# Patient Record
Sex: Female | Born: 1985 | Race: Black or African American | Hispanic: No | Marital: Single | Smoking: Current every day smoker
Health system: Southern US, Community
[De-identification: ages and names within clinical notes are randomized; demographics above are authoritative.]

## PROBLEM LIST (undated history)

## (undated) HISTORY — PX: WISDOM TOOTH EXTRACTION: SHX21

## (undated) HISTORY — PX: TUBAL LIGATION: SHX77

---
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
## 2004-10-15 ENCOUNTER — Emergency Department (HOSPITAL_COMMUNITY): Admission: EM | Admit: 2004-10-15 | Discharge: 2004-10-15 | Payer: Self-pay | Admitting: Family Medicine

## 2005-01-05 ENCOUNTER — Inpatient Hospital Stay (HOSPITAL_COMMUNITY): Admission: AD | Admit: 2005-01-05 | Discharge: 2005-01-05 | Payer: Self-pay | Admitting: *Deleted

## 2005-01-27 ENCOUNTER — Inpatient Hospital Stay (HOSPITAL_COMMUNITY): Admission: AD | Admit: 2005-01-27 | Discharge: 2005-01-27 | Payer: Self-pay | Admitting: *Deleted

## 2005-02-13 ENCOUNTER — Other Ambulatory Visit: Admission: RE | Admit: 2005-02-13 | Discharge: 2005-02-13 | Payer: Self-pay | Admitting: Obstetrics and Gynecology

## 2005-07-18 ENCOUNTER — Inpatient Hospital Stay (HOSPITAL_COMMUNITY): Admission: AD | Admit: 2005-07-18 | Discharge: 2005-07-20 | Payer: Self-pay | Admitting: Obstetrics and Gynecology

## 2006-02-24 ENCOUNTER — Emergency Department (HOSPITAL_COMMUNITY): Admission: EM | Admit: 2006-02-24 | Discharge: 2006-02-24 | Payer: Self-pay | Admitting: Emergency Medicine

## 2006-05-10 ENCOUNTER — Ambulatory Visit: Payer: Self-pay | Admitting: *Deleted

## 2006-05-10 ENCOUNTER — Inpatient Hospital Stay (HOSPITAL_COMMUNITY): Admission: AD | Admit: 2006-05-10 | Discharge: 2006-05-10 | Payer: Self-pay | Admitting: Gynecology

## 2006-07-14 ENCOUNTER — Ambulatory Visit: Payer: Self-pay | Admitting: *Deleted

## 2006-07-14 ENCOUNTER — Inpatient Hospital Stay (HOSPITAL_COMMUNITY): Admission: AD | Admit: 2006-07-14 | Discharge: 2006-07-14 | Payer: Self-pay | Admitting: Gynecology

## 2006-07-21 ENCOUNTER — Ambulatory Visit: Payer: Self-pay | Admitting: Obstetrics & Gynecology

## 2006-07-28 ENCOUNTER — Ambulatory Visit: Payer: Self-pay | Admitting: *Deleted

## 2006-07-30 ENCOUNTER — Inpatient Hospital Stay (HOSPITAL_COMMUNITY): Admission: AD | Admit: 2006-07-30 | Discharge: 2006-08-02 | Payer: Self-pay | Admitting: Obstetrics & Gynecology

## 2006-07-30 ENCOUNTER — Ambulatory Visit: Payer: Self-pay | Admitting: Obstetrics & Gynecology

## 2006-07-30 ENCOUNTER — Encounter (INDEPENDENT_AMBULATORY_CARE_PROVIDER_SITE_OTHER): Payer: Self-pay | Admitting: Specialist

## 2006-12-16 ENCOUNTER — Emergency Department (HOSPITAL_COMMUNITY): Admission: EM | Admit: 2006-12-16 | Discharge: 2006-12-16 | Payer: Self-pay | Admitting: Emergency Medicine

## 2007-02-07 ENCOUNTER — Encounter (INDEPENDENT_AMBULATORY_CARE_PROVIDER_SITE_OTHER): Payer: Self-pay | Admitting: Specialist

## 2007-02-07 ENCOUNTER — Ambulatory Visit (HOSPITAL_BASED_OUTPATIENT_CLINIC_OR_DEPARTMENT_OTHER): Admission: RE | Admit: 2007-02-07 | Discharge: 2007-02-07 | Payer: Self-pay | Admitting: Specialist

## 2007-11-26 ENCOUNTER — Emergency Department (HOSPITAL_COMMUNITY): Admission: EM | Admit: 2007-11-26 | Discharge: 2007-11-26 | Payer: Self-pay | Admitting: Family Medicine

## 2008-11-23 ENCOUNTER — Emergency Department (HOSPITAL_COMMUNITY): Admission: EM | Admit: 2008-11-23 | Discharge: 2008-11-23 | Payer: Self-pay | Admitting: Emergency Medicine

## 2008-12-29 ENCOUNTER — Emergency Department (HOSPITAL_COMMUNITY): Admission: EM | Admit: 2008-12-29 | Discharge: 2008-12-29 | Payer: Self-pay | Admitting: Emergency Medicine

## 2009-01-14 ENCOUNTER — Emergency Department (HOSPITAL_COMMUNITY): Admission: EM | Admit: 2009-01-14 | Discharge: 2009-01-14 | Payer: Self-pay | Admitting: Family Medicine

## 2009-04-21 ENCOUNTER — Emergency Department (HOSPITAL_COMMUNITY): Admission: EM | Admit: 2009-04-21 | Discharge: 2009-04-21 | Payer: Self-pay | Admitting: Emergency Medicine

## 2010-04-20 ENCOUNTER — Emergency Department (HOSPITAL_COMMUNITY): Admission: EM | Admit: 2010-04-20 | Discharge: 2010-04-20 | Payer: Self-pay | Admitting: Emergency Medicine

## 2010-09-26 ENCOUNTER — Emergency Department (HOSPITAL_COMMUNITY)
Admission: EM | Admit: 2010-09-26 | Discharge: 2010-09-26 | Disposition: A | Discharge status: Home / Self Care | Payer: Medicaid Other | Attending: Emergency Medicine | Admitting: Emergency Medicine

## 2010-09-26 DIAGNOSIS — H5710 Ocular pain, unspecified eye: Secondary | ICD-10-CM | POA: Insufficient documentation

## 2010-09-26 DIAGNOSIS — S0510XA Contusion of eyeball and orbital tissues, unspecified eye, initial encounter: Secondary | ICD-10-CM | POA: Insufficient documentation

## 2010-09-26 DIAGNOSIS — Y929 Unspecified place or not applicable: Secondary | ICD-10-CM | POA: Insufficient documentation

## 2010-09-26 DIAGNOSIS — H209 Unspecified iridocyclitis: Secondary | ICD-10-CM | POA: Insufficient documentation

## 2010-09-26 DIAGNOSIS — H5789 Other specified disorders of eye and adnexa: Secondary | ICD-10-CM | POA: Insufficient documentation

## 2010-09-26 DIAGNOSIS — IMO0002 Reserved for concepts with insufficient information to code with codable children: Secondary | ICD-10-CM | POA: Insufficient documentation

## 2010-10-20 ENCOUNTER — Inpatient Hospital Stay (INDEPENDENT_AMBULATORY_CARE_PROVIDER_SITE_OTHER)
Admission: RE | Admit: 2010-10-20 | Discharge: 2010-10-20 | Disposition: A | Discharge status: Home / Self Care | Payer: Medicaid Other | Source: Ambulatory Visit | Attending: Emergency Medicine | Admitting: Emergency Medicine

## 2010-10-20 DIAGNOSIS — L03319 Cellulitis of trunk, unspecified: Secondary | ICD-10-CM

## 2010-10-20 DIAGNOSIS — L02219 Cutaneous abscess of trunk, unspecified: Secondary | ICD-10-CM

## 2010-11-25 NOTE — Op Note (Signed)
Erin Logan, Erin Logan                   ACCOUNT NO.:  [id number]   MEDICAL RECORD NO.:  [id number]          PATIENT TYPE:  AMB   LOCATION:  DSC                          FACILITY:  [facility]   PHYSICIAN:  Earvin Hansen L. Shon Hough, M.D.DATE OF BIRTH:  [birth year]   DATE OF PROCEDURE:  02/07/2007  DATE OF DISCHARGE:                               OPERATIVE REPORT   SURGEON:  Earvin Hansen L. Shon Hough, M.D.   ANESTHESIA:  General.   INDICATIONS FOR PROCEDURE:  A 25 year old with severe hydradenitis  involving the right and left axillary regions, the right side greater  than left, and it creates boils and infections, necessitating multiple  incisions and drainages of the areas in the past.  The patient is now  being prepared for excision of the area - wide excision a Ryan-Pollack  closure, plastic fashion.   DESCRIPTION OF PROCEDURE:  The patient underwent general anesthesia,  intubated orally.  Preoperatively, the patient had had drawings  encompassing the whole hair-bearing area on the right axilla for a large  elliptical excision.  Prep was done with Hibiclens Soap and Solution and  walled off with sterile towels and drapes so as to make our sterile  field.  Xylocaine 0.5% with epinephrine was injected locally for  vasoconstriction, a total of 50 mL, 1:200,000 concentration.  The wound  edges were scored with a #15 blade.  Then, the Bovie was used on cutting  and coagulation for excision of the disease process down to underlying  superficial fascia.  We had to go in deeper in the midportion because of  the depth of the disease.  After proper hemostasis, the flaps were then  freed laterally and medially, and the flaps were transposed back into  the midline with 2-0 Monocryl deep subcu to the fascia, and then another  deep subcutaneous suture of 2-0 Monocryl, a 3-0 subdermal suture of  Monocryl, and then a running subcuticular suture of 3-0 Monocryl, then,  3-0 nylon, placed approximately 2 inches apart  for strength.  The wounds  were cleansed.  Half-inch Steri-Strips were applied.  Sterile dressings  were applied, including Xeroform, 4 x 4's, ABDs and Hyperfix tape.  She  withstood all the procedures very well, was taken to recovery in good  condition.  Estimated blood loss was less than 100 mL; complications,  none.      Yaakov Guthrie. Shon Hough, M.D.  Electronically Signed     GLT/MEDQ  D:  02/07/2007  T:  02/07/2007  Job:  [id number]

## 2010-11-28 NOTE — H&P (Signed)
Erin Logan, Erin Logan                   ACCOUNT NO.:  [id number]   MEDICAL RECORD NO.:  [id number]          PATIENT TYPE:  INP   LOCATION:  9171                          [facility]ILITY[facility]  [facility]   PHYSICIAN:  Naima A. Dillard, M.D. DATE OF BIRTH:  [birth year]   DATE OF ADMISSION:  07/18/2005  DATE OF DISCHARGE:                                HISTORY & PHYSICAL   This is a 25 year old gravida 1, para 0, at 40-4/7 weeks, who presents with  gush of fluid at midnight.  She does not have any bleeding and reports  positive fetal movement.  Pregnancy has been followed by the M.D. service  and remarkable for:   1.  Late to care.  2.  History of substance abuse.  3.  Smoker.  4.  Hidradenitis.  5.  Patient is a twin.  6.  History of abnormal Pap.  7.  Group B strep negative.   ALLERGIES:  None.   OBSTETRICAL HISTORY:  The patient is a primigravida.   MEDICAL HISTORY:  The patient has a history of abnormal Pap, history of  Trichomonas and childhood varicella.  The patient is a smoker and has  hidradenitis.   FAMILY HISTORY:  Remarkable for brother with paranoid schizophrenia, mother  with abuse and sister with a heart murmur.   SURGICAL HISTORY:  Negative.   GENETIC HISTORY:  Remarkable patient who is a twin and an uncle with twins.   SOCIAL HISTORY:  The patient is single.  Father of the baby, [place], is  involved and supportive.  She works as a C.N.A.  She is of the [employer].  She denies any alcohol abuse but did use marijuana early in  pregnancy and does smoke cigarettes.   PRENATAL LABORATORY DATA:  Hemoglobin 11.8, platelets 320.  Blood type O  positive.  Antibody screen negative.  Sickle cell negative.  RPR  nonreactive.  Rubella immune.  Hepatitis negative.  HIV negative.   HISTORY OF CURRENT PREGNANCY:  The patient entered care at 18 weeks.  She  had an ultrasound at 19 weeks, which was normal.  She had a Glucola at 29  weeks that was normal and was group B strep negative at  term.   OBJECTIVE:  VITAL SIGNS:  Stable, afebrile.  HEENT:  Within normal limits.  NECK:  Thyroid normal and not enlarged.  CHEST:  Clear to auscultation.  CARDIAC:  Regular rate and rhythm.  ABDOMEN:  Gravid.  Fetal monitor shows reactive fetal heart rate with  uterine contractions every eight minutes.  Cervix is 2, 90, -2, with a  vertex presentation.  Speculum exam shows a small amount of clear fluid with  positive Nitrazine, positive ferning.  EXTREMITIES: Within normal limits.   ASSESSMENT:  1.  Intrauterine pregnancy at 40-4/7 weeks.  2.  Premature rupture of membranes at term.  3.  Latent labor.   PLAN:  1.  Admit per Dr. Normand Sloop.  2.  Routine M.D. orders.  3.  Discussed expectant management versus Pitocin and the patient is not      sure what  she wants to do.      Marie L. Williams, C.N.M.      Naima A. Normand Sloop, M.D.  Electronically Signed    MLW/MEDQ  D:  07/18/2005  T:  07/18/2005  Job:  [id number]

## 2010-11-28 NOTE — Op Note (Signed)
Erin Logan, Erin Logan                   ACCOUNT NO.:  [id number]   MEDICAL RECORD NO.:  [id number]          PATIENT TYPE:  INP   LOCATION:  9316                          [facility]ACILITY:  [facility]   PHYSICIAN:  Phil D. Okey Dupre, M.D.     DATE OF BIRTH:  1985-09-08   DATE OF PROCEDURE:  07/30/2006  DATE OF DISCHARGE:                               OPERATIVE REPORT   PREOPERATIVE DIAGNOSIS:  Intrauterine pregnancy at 39+ weeks,  nonreassuring fetal heart tracing, desires permanent sterilization,  substance abuse, late prenatal care.   POSTOPERATIVE DIAGNOSIS:  Intrauterine pregnancy at 39+ weeks,  nonreassuring fetal heart tracing, desires permanent sterilization,  substance abuse, late prenatal care.   PROCEDURE:  Primary low transverse cesarean section and bilateral tubal  ligation with Filshie clips.   SURGEON:  Elsie Lincoln, MD   ASSISTANT:  Dr. Argentina Donovan and Dr. Wilburt Finlay.   ANESTHESIA:  General.   SPECIMEN:  Placenta sent to pathology.   ESTIMATED BLOOD LOSS:  700 mL.   COMPLICATIONS.:  None.   FINDINGS:  Viable female infant, Apgars 9 and 9 at one and five minutes,  respectively.  Please see delivery record for birth weight.   REASON FOR PROCEDURE:  This is a 25 year old gravida 2, para 1-0-0-1 at  64 plus weeks with history of cocaine abuse, limited prenatal care who  presented with rupture of membranes was started on Pitocin, progressed  to 5 cm dilation to follow up nonreassuring fetal heart tones and taken  for primary low transverse cesarean section   PROCEDURE:  The patient was taken to the operating room where she was  placed under general anesthesia.  She was then prepped and draped in the  normal sterile fashion in dorsal supine position with a leftward tilt.  A Pfannenstiel skin incision was made with the scalpel and carried  through to the underlying layer of fascia with the Bovie.  The fascia  was incised in the midline and the incision extended laterally with Mayo  scissors.  The superior aspect of the fascial incision was then grasped  with the Kocher clamps, elevated and underlying rectus muscles dissected  off bluntly.  Attention was then turned to the inferior aspect of the  incision which in a similar fashion was grasped, tented up with a Kocher  clamp and the rectus muscles dissected off bluntly.  The rectus muscles  were separated in midline and the peritoneum identified, tented up and  entered sharply with Metzenbaum scissors.  The peritoneal incision was  extended with good visualization of the bladder.  The bladder blade was  inserted and the lower uterine segment incised in a transverse fashion  with a scalpel. The incision was extended digitally.  The bladder blade  was removed and the infant's head was delivered atraumatically.  The  nose and mouth were suctioned and the cord clamp and cut.  The infant  was handed off to the waiting pediatricians.  The placenta was then  removed manually.  The uterus exteriorized and cleared of all clots and  debris.  The uterine incision was  repaired 1-0 chromic in a running  locked fashion.  Figure-of-eight stitches were used to reinforce the  uterine incision with excellent hemostasis.  Attention was then turned  to the tubal ligation.  The left fallopian tube was identified and  followed out to the fimbria.  The tube was grasped with two Babcock  clamps and Filshie clips were placed in the isthmus region of the  fallopian tube.  The right fallopian tube was then identified, followed  out to the fimbria.  Babcock clamps were used to grasp the tube and  Filshie clips were placed in the isthmus region of the tube.  Excellent  hemostasis was noted.  The uterus was returned to the abdomen and the  uterine incision was once again inspected and noted to have excellent  hemostasis.  The gutters were cleared of all clots and the fascia was  reapproximated with 0 Vicryl in running fashion.  The skin was  closed  with staples.  Of note, the uterus did seem somewhat boggy and 0.2 mg of  Methergine and 250 mcg of Hemabate and 1000 mg of Cytotec was given to  the patient in the OR.  The patient tolerated the procedure well.  Sponge, lap and needle counts were correct x2.  One gram of Ancef was  given at cord clamp.  The patient was taken to recovery room in stable  condition.     ______________________________  Paticia Stack, MD    ______________________________  Javier Glazier Okey Dupre, M.D.    Alphia Kava  D:  08/01/2006  T:  08/01/2006  Job:  [id number]

## 2010-11-28 NOTE — Discharge Summary (Signed)
Erin Logan, Erin Logan                   ACCOUNT NO.:  [id number]   MEDICAL RECORD NO.:  [id number]          PATIENT TYPE:  INP   LOCATION:  9316                          [facility]ILITY[facility]   PHYSICIAN:  Tracy L. Mayford Knife, M.D.DATE OF BIRTH:  09/03/1985   DATE OF ADMISSION:  07/30/2006  DATE OF DISCHARGE:  08/02/2006                               DISCHARGE SUMMARY   ADMISSION DIAGNOSES:  1. A 39-week 4-day intrauterine pregnancy dated by 28-week ultrasound      with spontaneous rupture of membranes.  2. Late prenatal care.  3. History of drug use.   DISCHARGE DIAGNOSES:  1. Status post primary low transverse cesarean section for      nonreassuring fetal heart tones.  2. Status post bilateral tubal ligation with Filshie clips.  3. Late prenatal care.  4. History of drug use this pregnancy.   PERTINENT LABORATORIES:  Admit hemoglobin was 10.5, discharge hemoglobin  10.   CONSULTANTS:  Social work.   HOSPITAL COURSE:  The patient is a 25 year old G2, P1-0-0-1 at 39 weeks  4 days by dated by a 28-week ultrasound that presented to the [facility] with  spontaneous rupture of membranes.  She was augmented with Pitocin and  progressed to 5 cm when fetal heart tones became nonreassuring.  The  patient was taken for primary cesarean section.  Please see operative  note for full details.  The patient had bilateral tubal ligation.   The postoperative course was unremarkable.  The patient voided,  ambulated, and was tolerating her pain without difficulty.  Social work  was consulted because of her history of drug use.  A CPS report was made  because the baby tested positive for marijuana.  Social work felt that  the patient was appropriately remorseful and the patient was discharged  home with her infant.   DISPOSITION:  Home.   FOLLOWUP:  Six weeks at [facility].   DISCHARGE MEDICATIONS:  1. Percocet 5 one to two p.o. q.4-6h. p.r.n.  2. Ibuprofen 600 mg p.o. q.6h. p.r.n.  3. Prenatal vitamins  daily.   DISCHARGE INSTRUCTIONS:  Routine including return with signs of  infection or increased vaginal bleeding.   ACTIVITY:  Nothing per vagina and no heavy lifting x6 weeks.           ______________________________  Marc Morgans Mayford Knife, M.D.     TLW/MEDQ  D:  09/01/2006  T:  09/01/2006  Job:  [id number]

## 2011-04-08 LAB — INFLUENZA A AND B ANTIGEN (CONVERTED LAB)
Inflenza A Ag: NEGATIVE
Influenza B Ag: NEGATIVE

## 2011-04-27 LAB — POCT HEMOGLOBIN-HEMACUE: Hemoglobin: 12.4

## 2012-03-24 ENCOUNTER — Encounter (HOSPITAL_COMMUNITY): Payer: Self-pay | Admitting: *Deleted

## 2012-03-24 ENCOUNTER — Emergency Department (HOSPITAL_COMMUNITY)
Admission: EM | Admit: 2012-03-24 | Discharge: 2012-03-24 | Disposition: A | Discharge status: Home / Self Care | Payer: Self-pay | Attending: Emergency Medicine | Admitting: Emergency Medicine

## 2012-03-24 DIAGNOSIS — G8929 Other chronic pain: Secondary | ICD-10-CM

## 2012-03-24 DIAGNOSIS — F17200 Nicotine dependence, unspecified, uncomplicated: Secondary | ICD-10-CM | POA: Insufficient documentation

## 2012-03-24 DIAGNOSIS — K029 Dental caries, unspecified: Secondary | ICD-10-CM | POA: Insufficient documentation

## 2012-03-24 MED ORDER — BUPIVACAINE-EPINEPHRINE PF 0.5-1:200000 % IJ SOLN
10.0000 mL | Freq: Once | INTRAMUSCULAR | Status: DC
  Filled 2012-03-24: qty 10

## 2012-03-24 MED ORDER — PENICILLIN V POTASSIUM 500 MG PO TABS: 500.0000 mg | ORAL_TABLET | Freq: Four times a day (QID) | ORAL | Status: AC

## 2012-03-24 MED ORDER — TRAMADOL HCL 50 MG PO TABS
100.0000 mg | ORAL_TABLET | Freq: Once | ORAL | Status: AC
  Administered 2012-03-24: 100 mg via ORAL
  Filled 2012-03-24: qty 2

## 2012-03-24 MED ORDER — PENICILLIN V POTASSIUM 250 MG PO TABS
500.0000 mg | ORAL_TABLET | Freq: Once | ORAL | Status: AC
  Administered 2012-03-24: 500 mg via ORAL
  Filled 2012-03-24: qty 2

## 2012-03-24 MED ORDER — TRAMADOL HCL 50 MG PO TABS: 50.0000 mg | ORAL_TABLET | Freq: Four times a day (QID) | ORAL | Status: AC | PRN

## 2012-03-24 MED ORDER — IBUPROFEN 400 MG PO TABS
800.0000 mg | ORAL_TABLET | Freq: Once | ORAL | Status: AC
  Administered 2012-03-24: 800 mg via ORAL
  Filled 2012-03-24: qty 2

## 2012-03-24 NOTE — ED Notes (Signed)
Patient reports she has a hole in her tooth on the left lower back tooth.  She states the took is broken.  Patient is also complaining of pain in her left ear

## 2012-03-24 NOTE — ED Notes (Signed)
Pt states she "cracked" the back tooth L side bottom roughly one year ago.  This area has become extremely painful (pain 10/10) with radiation to L ear. Part of the tooth is missing. Pt has used ASA, ibuprofen, and tramadol without relief.

## 2012-03-24 NOTE — ED Provider Notes (Signed)
History  This chart was scribed for Jones Skene, MD by Albertha Ghee Rifaie. This patient was seen in room TR06C/TR06C and the patient's care was started at 10:21 AM.   CSN: [id number]  Arrival date & time 03/24/12  1021   None     Chief Complaint  Patient presents with  . Dental Pain  . Otalgia     The history is provided by the patient. No language interpreter was used.   Erin Logan is a 26 y.o. female who presents to the Emergency Department complaining of one year of left lower dental pain that has been gradually worsening for the past 3 months that associates with a chipped tooth. She lists nausea and left otalgia as associated symptoms. Pain is aggravated with eating and drinking. She reports applying BC powder directly to the tooth with no improvement. She denies facial swelling, trouble swallowing, or sore throat as associated symptoms. Pt does not have a h/o chronic medical conditions. Pt is a current everyday smoker and occasional alcohol user.   History reviewed. No pertinent past medical history.  Past Surgical History  Procedure Date  . Wisdom tooth extraction     No family history on file.  History  Substance Use Topics  . Smoking status: Current Every Day Smoker  . Smokeless tobacco: Not on file  . Alcohol Use: Yes   No OB history provided  Review of Systems  REVIEW OF SYSTEMS:   1.) CONSTITUTIONAL: No fever, chills or systemic signs of infection. No recent, unexplained weight changes.   2.) HEENT: Positive for dental pain and left otalgia. No facial pain, sinus congestion or rhinorrhea is reported. Patient is denying any acute visual or hearing deficits. No sore throat or difficulty swallowing.  3.) NECK: No swelling or masses are reported.   4.) PULMONARY: No cough sputum production or shortness of breath was reported.   5.) CARDIAC: No palpitations, chest pain or pressure.   6.) ABDOMINAL: Positive for nausea. Denies abdominal pain, vomiting or  diarrhea. No Hematochezia or melena.  7.) GENITOURINARY: No burning with urination or frequency. No discharge.  8.) BACK: Denying any flank or CVA tenderness. No specific thoracic or lumbar pain.    9.) SKIN: No rashes, itching    Allergies  Review of patient's allergies indicates no known allergies.  Home Medications  No current outpatient prescriptions on file.  Triage Vitals: BP 111/70  Pulse 80  Temp 98.2 F (36.8 C) (Oral)  Resp 16  SpO2 100%  Physical Exam  Nursing notes reviewed.  Electronic medical record reviewed. VITAL SIGNS:   Filed Vitals:   03/24/12 1025  BP: 111/70  Pulse: 80  Temp: 98.2 F (36.8 C)  TempSrc: Oral  Resp: 16  SpO2: 100%   CONSTITUTIONAL: Awake, oriented, appears non-toxic HENT: Severely decayed #18.  No pus seen, no fluctuance near gums. Atraumatic, normocephalic, oral mucosa pink and moist, airway patent. Nares patent without drainage. External ears normal. EYES: Conjunctiva clear, EOMI, PERRLA NECK: Trachea midline, non-tender, supple CARDIOVASCULAR: Normal heart rate, Normal rhythm, No murmurs, rubs, gallops PULMONARY/CHEST: Clear to auscultation, no rhonchi, wheezes, or rales. Symmetrical breath sounds. Non-tender. ABDOMINAL: Non-distended, soft, non-tender - no rebound or guarding.  BS normal. NEUROLOGIC: Non-focal, moving all four extremities, no gross sensory or motor deficits. EXTREMITIES: No clubbing, cyanosis, or edema SKIN: Warm, Dry, No erythema, No rash   ED Course  Procedures (including critical care time)  DIAGNOSTIC STUDIES: Oxygen Saturation is 100% on room air, normal by my  interpretation.    COORDINATION OF CARE: 11:25AM-Discussed treatment plan which includes pain management with pt at bedside and pt agreed to plan. 12:39PM-Administered 2cc Marcaine w/ epi injection. Pt tolerated well.  Labs Reviewed - No data to display No results found.   No diagnosis found.    MDM  Erin Logan is a 26 y.o. female  is a very frustrated young woman presenting with dental pain.  Pt says she doesn't have money for a dentist and hoped the ER would pull her tooth.  Pt was going to leave AMA - but agreed to finally let me treat her.  Injected gums with marcaine and will give her Rx for ultram and PCN.  She is referred to [facility] dental clinic for extraction of severely decayed tooth. Return precautions given.  I personally performed the services described in this documentation, which was scribed in my presence. The recorded information has been reviewed and considered. Jones Skene, M.D.      Jones Skene, MD 03/28/12 1244

## 2012-04-08 ENCOUNTER — Emergency Department (HOSPITAL_COMMUNITY): Payer: Self-pay

## 2012-04-08 ENCOUNTER — Emergency Department (HOSPITAL_COMMUNITY)
Admission: EM | Admit: 2012-04-08 | Discharge: 2012-04-08 | Disposition: A | Discharge status: Home / Self Care | Payer: Self-pay | Attending: Emergency Medicine | Admitting: Emergency Medicine

## 2012-04-08 DIAGNOSIS — J029 Acute pharyngitis, unspecified: Secondary | ICD-10-CM

## 2012-04-08 DIAGNOSIS — B9789 Other viral agents as the cause of diseases classified elsewhere: Secondary | ICD-10-CM | POA: Insufficient documentation

## 2012-04-08 DIAGNOSIS — F17200 Nicotine dependence, unspecified, uncomplicated: Secondary | ICD-10-CM | POA: Insufficient documentation

## 2012-04-08 DIAGNOSIS — J069 Acute upper respiratory infection, unspecified: Secondary | ICD-10-CM | POA: Insufficient documentation

## 2012-04-08 LAB — RAPID STREP SCREEN (MED CTR MEBANE ONLY): Streptococcus, Group A Screen (Direct): NEGATIVE

## 2012-04-08 MED ORDER — IBUPROFEN 800 MG PO TABS: 800.0000 mg | ORAL_TABLET | Freq: Three times a day (TID) | ORAL | Status: DC | PRN

## 2012-04-08 MED ORDER — ACETAMINOPHEN-CODEINE 120-12 MG/5ML PO SOLN: 10.0000 mL | ORAL | Status: DC | PRN

## 2012-04-08 MED ORDER — GUAIFENESIN ER 1200 MG PO TB12: 1.0000 | ORAL_TABLET | Freq: Two times a day (BID) | ORAL | Status: DC

## 2012-04-08 NOTE — ED Provider Notes (Signed)
Medical screening examination/treatment/procedure(s) were performed by non-physician practitioner and as supervising physician I was immediately available for consultation/collaboration.   Celene Kras, MD 04/08/12 [phone]

## 2012-04-08 NOTE — ED Notes (Addendum)
Pt given work note for today

## 2012-04-08 NOTE — ED Notes (Signed)
Patient transported to X-ray 

## 2012-04-08 NOTE — ED Provider Notes (Signed)
History     CSN: [id number]  Arrival date & time 04/08/12  0751   First MD Initiated Contact with Patient 04/08/12 0800      Chief Complaint  Patient presents with  . Sore Throat    (Consider location/radiation/quality/duration/timing/severity/associated sxs/prior treatment) HPI Patient presents emergency department with sore throat, and cough for the last 24 hours.  Patient, states, that she has burning in her throat, and when she coughs she has discomfort in her chest.  Patient denies fevers, nausea, vomiting, abdominal pain, headache, visual changes, dizziness, or weakness.  Patient, states she took some over-the-counter cough suppressant, which worked for an hour or so, but her cough, returned. No past medical history on file.  Past Surgical History  Procedure Date  . Wisdom tooth extraction     No family history on file.  History  Substance Use Topics  . Smoking status: Current Every Day Smoker  . Smokeless tobacco: Not on file  . Alcohol Use: Yes    OB History    Grav Para Term Preterm Abortions TAB SAB Ect Mult Living                  Review of Systems All pertinent positives and negatives in the history of present illness  Allergies  Review of patient's allergies indicates no known allergies.  Home Medications   Current Outpatient Rx  Name Route Sig Dispense Refill  . OVER THE COUNTER MEDICATION Oral Take 20 mLs by mouth every 6 (six) hours as needed. For cough   childrens cough medicine.      BP 103/75  Pulse 110  Temp 97.8 F (36.6 C) (Oral)  Resp 18  SpO2 96%  LMP 03/18/2012  Physical Exam  Nursing note and vitals reviewed. Constitutional: She is oriented to person, place, and time. She appears well-developed and well-nourished.  HENT:  Head: Normocephalic and atraumatic. No trismus in the jaw.  Mouth/Throat: Uvula is midline and mucous membranes are normal. No uvula swelling. Posterior oropharyngeal erythema present. No oropharyngeal  exudate, posterior oropharyngeal edema or tonsillar abscesses.  Eyes: Pupils are equal, round, and reactive to light.  Neck: Normal range of motion. Neck supple.  Cardiovascular: Normal rate, regular rhythm and normal heart sounds.  Exam reveals no gallop and no friction rub.   No murmur heard. Pulmonary/Chest: Effort normal and breath sounds normal. No respiratory distress.  Neurological: She is alert and oriented to person, place, and time.  Skin: Skin is warm and dry. No rash noted.    ED Course  Procedures (including critical care time)   Labs Reviewed  RAPID STREP SCREEN   Dg Chest 2 View  04/08/2012  *RADIOLOGY REPORT*  Clinical Data: Cough, fever, nasal congestion.  CHEST - 2 VIEW  Comparison: None  Findings: Heart and mediastinal contours are within normal limits. No focal opacities or effusions.  No acute bony abnormality.  IMPRESSION: No active cardiopulmonary disease.   Original Report Authenticated By: Cyndie Chime, M.D.    Patient be treated for viral URI next of her HPI and physical exam findings.  Patient is advised to increase her fluid intake and rest as much as possible.  She is told to return here for any worsening in her condition    MDM          Carlyle Dolly, PA-C 04/08/12 [phone]

## 2012-04-08 NOTE — ED Notes (Signed)
Pt c/o sore throat and shob when lying down since yesterday

## 2012-04-08 NOTE — ED Notes (Addendum)
Pt dc'd home

## 2012-11-26 ENCOUNTER — Emergency Department (HOSPITAL_COMMUNITY)
Admission: EM | Admit: 2012-11-26 | Discharge: 2012-11-26 | Disposition: A | Discharge status: Home / Self Care | Payer: Self-pay | Attending: Emergency Medicine | Admitting: Emergency Medicine

## 2012-11-26 ENCOUNTER — Encounter (HOSPITAL_COMMUNITY): Payer: Self-pay | Admitting: Emergency Medicine

## 2012-11-26 DIAGNOSIS — L0501 Pilonidal cyst with abscess: Secondary | ICD-10-CM | POA: Insufficient documentation

## 2012-11-26 DIAGNOSIS — F17200 Nicotine dependence, unspecified, uncomplicated: Secondary | ICD-10-CM | POA: Insufficient documentation

## 2012-11-26 MED ORDER — SULFAMETHOXAZOLE-TRIMETHOPRIM 800-160 MG PO TABS: 1.0000 | ORAL_TABLET | Freq: Two times a day (BID) | ORAL | Status: DC

## 2012-11-26 MED ORDER — HYDROCODONE-ACETAMINOPHEN 5-325 MG PO TABS: 1.0000 | ORAL_TABLET | Freq: Four times a day (QID) | ORAL | Status: DC | PRN

## 2012-11-26 NOTE — ED Provider Notes (Signed)
History     CSN: [id number]  Arrival date & time 11/26/12  0940   First MD Initiated Contact with Patient 11/26/12 [phone]      Chief Complaint  Patient presents with  . Recurrent Skin Infections    (Consider location/radiation/quality/duration/timing/severity/associated sxs/prior treatment) HPI Comments: Patient presents to the emergency department with chief complaint of abscess. She states that she has noticed an abscess on her buttocks for the past week. She states that it is very painful. She describes her pain as a sharp aching pain, but is worsened with sitting and walking. She states the pain is moderate to severe. She has not tried anything to alleviate her symptoms. She has a history of abscesses under her arms. She denies fevers, chills, nausea, or vomiting.  The history is provided by the patient. No language interpreter was used.    History reviewed. No pertinent past medical history.  Past Surgical History  Procedure Laterality Date  . Wisdom tooth extraction      History reviewed. No pertinent family history.  History  Substance Use Topics  . Smoking status: Current Every Day Smoker  . Smokeless tobacco: Not on file  . Alcohol Use: Yes    OB History   Grav Para Term Preterm Abortions TAB SAB Ect Mult Living                  Review of Systems  All other systems reviewed and are negative.    Allergies  Review of patient's allergies indicates no known allergies.  Home Medications   Current Outpatient Rx  Name  Route  Sig  Dispense  Refill  . HYDROcodone-acetaminophen (NORCO/VICODIN) 5-325 MG per tablet   Oral   Take 1 tablet by mouth every 6 (six) hours as needed for pain.   13 tablet   0   . sulfamethoxazole-trimethoprim (SEPTRA DS) 800-160 MG per tablet   Oral   Take 1 tablet by mouth every 12 (twelve) hours.   20 tablet   0     BP 107/82  Pulse 72  Temp(Src) 97.8 F (36.6 C) (Oral)  Resp 16  SpO2 100%  LMP 11/23/2012  Physical  Exam  Nursing note and vitals reviewed. Constitutional: She is oriented to person, place, and time. She appears well-developed and well-nourished.  HENT:  Head: Normocephalic and atraumatic.  Eyes: Conjunctivae and EOM are normal.  Neck: Normal range of motion.  Cardiovascular: Normal rate.   Pulmonary/Chest: Effort normal.  Abdominal: She exhibits no distension.  Musculoskeletal: Normal range of motion.  Neurological: She is alert and oriented to person, place, and time.  Skin: Skin is dry.  3 x 3 cm pilonidal abscess, with mild induration, and fluctuance, no surrounding erythema or cellulitis  Psychiatric: She has a normal mood and affect. Her behavior is normal. Judgment and thought content normal.    ED Course  Procedures (including critical care time)  Labs Reviewed - No data to display No results found. INCISION AND DRAINAGE Performed by: Roxy Horseman Consent: Verbal consent obtained. Risks and benefits: risks, benefits and alternatives were discussed Type: abscess  Body area: Buttock  Anesthesia: local infiltration  Incision was made with a scalpel.  Local anesthetic: lidocaine 2 % with epinephrine  Anesthetic total: 5 ml  Complexity: complex Blunt dissection to break up loculations  Drainage: purulent  Drainage amount: 10-15 ml  Packing material: Not packed   Patient tolerance: Patient tolerated the procedure well with no immediate complications.     1. Pilonidal  abscess       MDM  Patient with pilonidal abscess. The abscess was drained in the emergency department with good success. Will discharge the patient to home with Bactrim and Vicodin. Followup with [facility] if symptoms worsen or return. Patient does not have fever, chills, nausea, or vomiting. Vitals are stable. Patient is stable and ready for discharge.        Roxy Horseman, PA-C 11/26/12 1116

## 2012-11-26 NOTE — ED Notes (Signed)
Provider completed  I&D area cleaned with NS and sterile dry dressing applied.

## 2012-11-26 NOTE — ED Notes (Signed)
Patient discharge with instructions and she verbalizes an understanding

## 2012-11-26 NOTE — ED Notes (Signed)
Patient presents to the ED with complaints of reoccurring abscesses. Area affected is coccyx area times 1 week, redness and skin is hot to touch no drainage noted

## 2012-11-27 NOTE — ED Provider Notes (Signed)
Medical screening examination/treatment/procedure(s) were performed by non-physician practitioner and as supervising physician I was immediately available for consultation/collaboration.  Gilda Crease, MD 11/27/12 [phone]

## 2013-06-23 ENCOUNTER — Encounter (HOSPITAL_COMMUNITY): Payer: Self-pay | Admitting: Emergency Medicine

## 2013-06-23 ENCOUNTER — Emergency Department (HOSPITAL_COMMUNITY)
Admission: EM | Admit: 2013-06-23 | Discharge: 2013-06-23 | Disposition: A | Discharge status: Home / Self Care | Payer: Medicaid Other | Attending: Emergency Medicine | Admitting: Emergency Medicine

## 2013-06-23 DIAGNOSIS — Z7251 High risk heterosexual behavior: Secondary | ICD-10-CM

## 2013-06-23 DIAGNOSIS — R109 Unspecified abdominal pain: Secondary | ICD-10-CM

## 2013-06-23 DIAGNOSIS — N898 Other specified noninflammatory disorders of vagina: Secondary | ICD-10-CM | POA: Insufficient documentation

## 2013-06-23 DIAGNOSIS — Z3202 Encounter for pregnancy test, result negative: Secondary | ICD-10-CM | POA: Insufficient documentation

## 2013-06-23 DIAGNOSIS — Z9851 Tubal ligation status: Secondary | ICD-10-CM | POA: Insufficient documentation

## 2013-06-23 DIAGNOSIS — F17200 Nicotine dependence, unspecified, uncomplicated: Secondary | ICD-10-CM | POA: Insufficient documentation

## 2013-06-23 LAB — CBC
Hemoglobin: 12.9 g/dL (ref 12.0–15.0)
MCH: 31.2 pg (ref 26.0–34.0)
MCHC: 35.1 g/dL (ref 30.0–36.0)
Platelets: 321 10*3/uL (ref 150–400)
RBC: 4.14 MIL/uL (ref 3.87–5.11)

## 2013-06-23 LAB — POCT I-STAT, CHEM 8
Creatinine, Ser: 1 mg/dL (ref 0.50–1.10)
Hemoglobin: 13.9 g/dL (ref 12.0–15.0)
Potassium: 3.5 mEq/L (ref 3.5–5.1)
Sodium: 142 mEq/L (ref 135–145)
TCO2: 23 mmol/L (ref 0–100)

## 2013-06-23 LAB — URINALYSIS, ROUTINE W REFLEX MICROSCOPIC
Bilirubin Urine: NEGATIVE
Glucose, UA: NEGATIVE mg/dL
Protein, ur: NEGATIVE mg/dL
Urobilinogen, UA: 0.2 mg/dL (ref 0.0–1.0)

## 2013-06-23 LAB — WET PREP, GENITAL: Trich, Wet Prep: NONE SEEN

## 2013-06-23 LAB — URINE MICROSCOPIC-ADD ON

## 2013-06-23 MED ORDER — LIDOCAINE HCL 2 % IJ SOLN
2.0000 mL | Freq: Once | INTRAMUSCULAR | Status: AC
  Administered 2013-06-23: 0.5 mg
  Filled 2013-06-23: qty 20

## 2013-06-23 MED ORDER — AZITHROMYCIN 250 MG PO TABS
1000.0000 mg | ORAL_TABLET | Freq: Once | ORAL | Status: AC
  Administered 2013-06-23: 1000 mg via ORAL
  Filled 2013-06-23: qty 4

## 2013-06-23 MED ORDER — CEFTRIAXONE SODIUM 250 MG IJ SOLR
250.0000 mg | Freq: Once | INTRAMUSCULAR | Status: AC
  Administered 2013-06-23: 250 mg via INTRAMUSCULAR
  Filled 2013-06-23: qty 250

## 2013-06-23 NOTE — ED Notes (Signed)
Pt up to restroom with out any assistance, pt with a steady gait

## 2013-06-23 NOTE — ED Notes (Signed)
Pt undressed, in gown, on continuous pulse oximetry and blood pressure cuff 

## 2013-06-23 NOTE — ED Provider Notes (Signed)
CSN: [id number]     Arrival date & time 06/23/13  8657 History   First MD Initiated Contact with Patient 06/23/13 [phone]     Chief Complaint  Patient presents with  . Abdominal Pain   (Consider location/radiation/quality/duration/timing/severity/associated sxs/prior Treatment) HPI Comments: Patient is a 27 y/o G2P2 female with a hx of tubal ligation who presents today for abdominal cramping x 9 days. Patient states that symptoms are intermittent and mildly relieved with ibuprofen. She endorses the pain to be primarily in her lower abdomen/suprpubic region, but also sporadically migrates to her epigastrium. Symptom onset was prior to patient's menses which began 1 week ago and ended yesterday. Flow of menses was normal, per patient. She endorses an associated thick vaginal discharge. She denies associated fever, CP, SOB, vomiting, diarrhea, melena, hematochezia, dysuria, hematuria, numbness/tingling, and weakness. She endorses unprotected sexual intercourse with 1 partner; her boyfriend of 6 months.  The history is provided by the patient. No language interpreter was used.    History reviewed. No pertinent past medical history. Past Surgical History  Procedure Laterality Date  . Wisdom tooth extraction    . Tubal ligation     History reviewed. No pertinent family history. History  Substance Use Topics  . Smoking status: Current Every Day Smoker  . Smokeless tobacco: Not on file  . Alcohol Use: Yes   OB History   Grav Para Term Preterm Abortions TAB SAB Ect Mult Living                 Review of Systems  Gastrointestinal: Positive for abdominal pain ("cramping").  Genitourinary: Positive for vaginal discharge.  All other systems reviewed and are negative.    Allergies  Review of patient's allergies indicates no known allergies.  Home Medications   Current Outpatient Rx  Name  Route  Sig  Dispense  Refill  . Multiple Vitamin (MULTIVITAMIN WITH MINERALS) TABS tablet   Oral  Take 1 tablet by mouth daily.          BP 131/88  Pulse 68  Temp(Src) 98.9 F (37.2 C) (Oral)  Resp 20  SpO2 100%  LMP 06/21/2013  Physical Exam  Nursing note and vitals reviewed. Constitutional: She is oriented to person, place, and time. She appears well-developed and well-nourished. No distress.  HENT:  Head: Normocephalic and atraumatic.  Eyes: Conjunctivae and EOM are normal. No scleral icterus.  Neck: Normal range of motion.  Cardiovascular: Normal rate, regular rhythm and intact distal pulses.   Pulmonary/Chest: Effort normal. No respiratory distress.  Abdominal: Soft. She exhibits no mass. There is tenderness (mild lower abdominal TTP on deep palpation; no focal TTP appreciated). There is no rebound and no guarding.  Soft obese abdomen without peritoneal signs or guarding  Genitourinary: There is no rash, tenderness, lesion or injury on the right labia. There is no rash, tenderness, lesion or injury on the left labia. Uterus is not tender. Cervix exhibits no motion tenderness, no discharge and no friability. Right adnexum displays no mass, no tenderness and no fullness. Left adnexum displays no mass, no tenderness and no fullness. No tenderness or bleeding around the vagina. No foreign body around the vagina. No signs of injury around the vagina. Vaginal discharge (small amount of clear mucousy d/c in vault) found.  Musculoskeletal: Normal range of motion.  Neurological: She is alert and oriented to person, place, and time.  Skin: Skin is warm and dry. No rash noted. She is not diaphoretic. No erythema. No pallor.  Psychiatric: She  has a normal mood and affect. Her behavior is normal.    ED Course  Procedures (including critical care time) Labs Review Labs Reviewed  WET PREP, GENITAL - Abnormal; Notable for the following:    WBC, Wet Prep HPF POC TOO NUMEROUS TO COUNT (*)    All other components within normal limits  URINALYSIS, ROUTINE W REFLEX MICROSCOPIC - Abnormal;  Notable for the following:    Hgb urine dipstick TRACE (*)    All other components within normal limits  URINE MICROSCOPIC-ADD ON - Abnormal; Notable for the following:    Squamous Epithelial / LPF FEW (*)    All other components within normal limits  GC/CHLAMYDIA PROBE AMP  PREGNANCY, URINE  CBC  POCT I-STAT, CHEM 8   Imaging Review No results found.  EKG Interpretation   None       MDM   1. Unprotected sexual intercourse   2. Abdominal cramping    Patient is a 27 year old female who presents for lower abdominal cramping that is intermittent and has been present for the last 10 days. Patient denies associated fever, vomiting, and pelvic pain, but she does endorse some associated vaginal discharge. Patient with a history of unprotected sexual intercourse with her boyfriend of 6 months. She is well and nontoxic appearing, hemodynamically stable, and afebrile on arrival today. Physical exam significant for mild tenderness to palpation in the suprapubic region with deep palpation. No peritoneal signs or guarding appreciated. Pelvic and bimanual exam appreciated no significant CMT or adnexal tenderness.  Patient without leukocytosis today. Urinalysis nonsuggestive of infection. Urine pregnancy negative. Wet prep today with TNTC white blood cells without evidence of yeast infection or bacterial vaginosis. Given patient's symptoms and history of unprotected sexual intercourse, patient will be treated today for gonorrhea and Chlamydia. Do not believe emergent pelvic U/S imaging is indicated at this time as my suspicion for TOA, salpingitis, and ovarian torsion is low given duration of symptoms, lack of fever/severe abdominal pain/vomiting, and lack of leukocytosis. Patient stable for discharge with an OB/GYN followup as an outpatient. Patient has been advised to refrain from sexual intercourse until her sexual partners are tested and treated for STDs. Return precautions discussed and patient  agreeable to plan with no unaddressed concerns.    Antony Madura, PA-C 06/23/13 1139

## 2013-06-23 NOTE — ED Notes (Addendum)
Pt reports lower abd pain x7-10 days ago, states she started her menstrual cycle 7 days ago and completed her cycle 2 days ago, pt reports having a bright oarnge, red color to her menstrual cycle, pt also reports having a heavier vaginal discharge but denies any vaginal odor or burning with urination. Pt states she also had vaginal itching while on her cycle but has now subsided

## 2013-06-23 NOTE — ED Notes (Signed)
Pt discharged home with all belongings, alert and ambulatory, no new RX prescribed, pt verbalizes understanding of discharge instructions, no narcotics given in ED, pt drove self home

## 2013-06-23 NOTE — ED Notes (Signed)
Pt c/o abd pain x7-10 days

## 2013-06-24 LAB — GC/CHLAMYDIA PROBE AMP
CT Probe RNA: POSITIVE — AB
GC Probe RNA: NEGATIVE

## 2013-06-24 NOTE — ED Provider Notes (Signed)
Medical screening examination/treatment/procedure(s) were performed by non-physician practitioner and as supervising physician I was immediately available for consultation/collaboration.  EKG Interpretation   None         Laray Anger, DO 06/24/13 1620

## 2013-06-25 ENCOUNTER — Telehealth (HOSPITAL_COMMUNITY): Payer: Self-pay | Admitting: Emergency Medicine

## 2013-06-25 NOTE — ED Notes (Signed)
Patient has +Chlamydia. 

## 2013-06-25 NOTE — ED Notes (Signed)
+  Chlamydia. Patient treated with Rocephin and Zithromax. [employer] faxed. 

## 2015-03-20 ENCOUNTER — Emergency Department (HOSPITAL_COMMUNITY)
Admission: EM | Admit: 2015-03-20 | Discharge: 2015-03-20 | Disposition: A | Discharge status: Home / Self Care | Payer: Medicaid Other | Attending: Emergency Medicine | Admitting: Emergency Medicine

## 2015-03-20 ENCOUNTER — Encounter (HOSPITAL_COMMUNITY): Payer: Self-pay | Admitting: Emergency Medicine

## 2015-03-20 DIAGNOSIS — Z720 Tobacco use: Secondary | ICD-10-CM | POA: Insufficient documentation

## 2015-03-20 DIAGNOSIS — Z3202 Encounter for pregnancy test, result negative: Secondary | ICD-10-CM | POA: Insufficient documentation

## 2015-03-20 DIAGNOSIS — Z202 Contact with and (suspected) exposure to infections with a predominantly sexual mode of transmission: Secondary | ICD-10-CM | POA: Insufficient documentation

## 2015-03-20 DIAGNOSIS — R102 Pelvic and perineal pain: Secondary | ICD-10-CM | POA: Insufficient documentation

## 2015-03-20 LAB — URINALYSIS, ROUTINE W REFLEX MICROSCOPIC
BILIRUBIN URINE: NEGATIVE
GLUCOSE, UA: NEGATIVE mg/dL
Hgb urine dipstick: NEGATIVE
KETONES UR: NEGATIVE mg/dL
Leukocytes, UA: NEGATIVE
Nitrite: NEGATIVE
Protein, ur: NEGATIVE mg/dL
Specific Gravity, Urine: 1.022 (ref 1.005–1.030)
Urobilinogen, UA: 0.2 mg/dL (ref 0.0–1.0)
pH: 6.5 (ref 5.0–8.0)

## 2015-03-20 LAB — WET PREP, GENITAL
Trich, Wet Prep: NONE SEEN
Yeast Wet Prep HPF POC: NONE SEEN

## 2015-03-20 LAB — RPR: RPR Ser Ql: NONREACTIVE

## 2015-03-20 LAB — PREGNANCY, URINE: Preg Test, Ur: NEGATIVE

## 2015-03-20 LAB — HIV ANTIBODY (ROUTINE TESTING W REFLEX): HIV Screen 4th Generation wRfx: NONREACTIVE

## 2015-03-20 MED ORDER — IBUPROFEN 200 MG PO TABS
400.0000 mg | ORAL_TABLET | Freq: Once | ORAL | Status: AC
  Administered 2015-03-20: 400 mg via ORAL
  Filled 2015-03-20: qty 2

## 2015-03-20 NOTE — ED Notes (Signed)
PT states she is having lower abd pain and has had a period twice in one month. Pt states both were normal periods, not too heavy. Denies other symptoms including N/V/lightheaded/dizzy

## 2015-03-20 NOTE — ED Provider Notes (Signed)
CSN: [id number]     Arrival date & time 03/20/15  4098 History   First MD Initiated Contact with Patient 03/20/15 [phone]     Chief Complaint  Patient presents with  . Abdominal Pain    HPI  Erin Logan is a 29 y.o. female presenting with bilateral pelvic pain/pressure. She states pain feels like it is in her hips and vagina. Kind of cramping in nature. She has had this pain for the last few weeks. Pain does not radiate to anywhere. Patient is concerned about possible STD exposure as she has had unprotected intercourse. She has history of STDs in her past. Denies any abnormal vaginal discharge, odor, or bleeding. Denies dysuria symptoms. She has taken tylenol for pain which provided some relief. Constant pelvic pressure without worsening of symptoms.   Her LMP was the last week of august. She states she had two menstrual cycles in August. Both were 4-5 days of normal bleeding. Patient has h/o tubal ligation.    History reviewed. No pertinent past medical history. Past Surgical History  Procedure Laterality Date  . Wisdom tooth extraction    . Tubal ligation     History reviewed. No pertinent family history. Social History  Substance Use Topics  . Smoking status: Current Every Day Smoker    Types: Cigarettes  . Smokeless tobacco: None  . Alcohol Use: Yes     Comment: occasionally   OB History    No data available     Review of Systems  Constitutional: Negative for fever.  HENT: Negative for mouth sores.   Respiratory: Negative for shortness of breath.   Cardiovascular: Negative for chest pain.  Gastrointestinal: Negative for diarrhea and constipation.  Genitourinary: Positive for pelvic pain. Negative for dysuria and vaginal discharge.  Skin: Negative for rash.  Also per HPI  Allergies  Review of patient's allergies indicates no known allergies.  Home Medications   Prior to Admission medications   Medication Sig Start Date End Date Taking? Authorizing Provider  Multiple  Vitamin (MULTIVITAMIN WITH MINERALS) TABS tablet Take 1 tablet by mouth daily.   Yes Historical Provider, MD   BP 113/72 mmHg  Pulse 66  Temp(Src) 98.2 F (36.8 C) (Oral)  Resp 16  Ht  (1.626 m)  Wt 162 lb (73.483 kg)  BMI 27.79 kg/m2  SpO2 100%  LMP 03/06/2015 Physical Exam  Constitutional: She is oriented to person, place, and time. She appears well-developed and well-nourished. No distress.  HENT:  Head: Normocephalic and atraumatic.  Eyes: EOM are normal.  Cardiovascular: Normal rate, regular rhythm, normal heart sounds and intact distal pulses.   Pulmonary/Chest: Effort normal and breath sounds normal.  Abdominal: Soft. Bowel sounds are normal.  Tenderness to palpation in bilateral pelvis and suprapubically.  Genitourinary: Vagina normal. Cervix exhibits no motion tenderness and no friability. No vaginal discharge found.     Neurological: She is alert and oriented to person, place, and time.  Skin: Skin is warm and dry. No rash noted.  Psychiatric: She has a normal mood and affect.    ED Course  Procedures (including critical care time) Labs Review Labs Reviewed  WET PREP, GENITAL - Abnormal; Notable for the following:    Clue Cells Wet Prep HPF POC FEW (*)    WBC, Wet Prep HPF POC FEW (*)    All other components within normal limits  URINALYSIS, ROUTINE W REFLEX MICROSCOPIC (NOT AT [facility]) - Abnormal; Notable for the following:    APPearance HAZY (*)  All other components within normal limits  PREGNANCY, URINE  RPR  HIV ANTIBODY (ROUTINE TESTING)  GC/CHLAMYDIA PROBE AMP ([place]) NOT AT [facility]   Imaging Review No results found. I have personally reviewed and evaluated these images and lab results as part of my medical decision-making.   EKG Interpretation None     MDM   Final diagnoses:  Pelvic pain in female  Potential exposure to STD   Patient presented to ED for pelvic pain. No concerning signs or symptoms on history or exam. STD testing  performed. UA and wet prep unremarkable. Urine pregnancy test negative. Patient given ibuprofen for pain control. No concern at this time for PID.   Information given to patient on finding a PCP and pelvic pain. She will be contacted and treated if any of her blood work returns positive for STDs.    Caryl Ada, DO PGY-2, [facility]    Pincus Large, DO 03/20/15 1124  Azalia Bilis, MD 03/20/15 [phone]

## 2015-03-20 NOTE — ED Notes (Signed)
Pt placed in gown and in bed. Pt monitored by pulse ox and bp cuff. Ambulated to bathroom. Steady gait.

## 2015-03-20 NOTE — Discharge Instructions (Signed)
In ED wet prep and urine were unremarakble  You will be contacted if any other results come back positive and treatment will be given at that time   Below you will find information on pelvic pain and when to seek help immediately  It is important to get a primary care doctor who can follow-up with your symptoms and do further testing as needed  Pelvic Pain Pelvic pain is pain felt below the belly button and between your hips. It can be caused by many different things. It is important to get help right away. This is especially true for severe, sharp, or unusual pain that comes on suddenly.  HOME CARE  Only take medicine as told by your doctor.  Rest as told by your doctor.  Eat a healthy diet, such as fruits, vegetables, and lean meats.  Drink enough fluids to keep your pee (urine) clear or pale yellow, or as told.  Avoid sex (intercourse) if it causes pain.  Apply warm or cold packs to your lower belly (abdomen). Use the type of pack that helps the pain.  Avoid situations that cause you stress.  Keep a journal to track your pain. Write down:  When the pain started.  Where it is located.  If there are things that seem to be related to the pain, such as food or your period.  Follow up with your doctor as told. GET HELP RIGHT AWAY IF:   You have heavy bleeding from the vagina.  You have more pelvic pain.  You feel lightheaded or pass out (faint).  You have chills.  You have pain when you pee or have blood in your pee.  You cannot stop having watery poop (diarrhea).  You cannot stop throwing up (vomiting).  You have a fever or lasting symptoms for more than 3 days.  You have a fever and your symptoms suddenly get worse.  You are being physically or sexually abused.  Your medicine does not help your pain.  You have fluid (discharge) coming from your vagina that is not normal. MAKE SURE YOU:  Understand these instructions.  Will watch your  condition.  Will get help if you are not doing well or get worse. Document Released: 12/16/2007 Document Revised: 12/29/2011 Document Reviewed: 10/19/2011 [facility] Patient Information 2015 [place], [place]. This information is not intended to replace advice given to you by your health care provider. Make sure you discuss any questions you have with your health care provider.   Emergency Department Resource Guide 1) Find a Doctor and Pay Out of Pocket Although you won't have to find out who is covered by your insurance plan, it is a good idea to ask around and get recommendations. You will then need to call the office and see if the doctor you have chosen will accept you as a new patient and what types of options they offer for patients who are self-pay. Some doctors offer discounts or will set up payment plans for their patients who do not have insurance, but you will need to ask so you aren't surprised when you get to your appointment.  2) Contact Your Local Health Department Not all health departments have doctors that can see patients for sick visits, but many do, so it is worth a call to see if yours does. If you don't know where your local health department is, you can check in your phone book. The CDC also has a tool to help you locate your state's health department, and  many state websites also have listings of all of their local health departments.  3) Find a Walk-in Clinic If your illness is not likely to be very severe or complicated, you may want to try a walk in clinic. These are popping up all over the country in pharmacies, drugstores, and shopping centers. They're usually staffed by nurse practitioners or physician assistants that have been trained to treat common illnesses and complaints. They're usually fairly quick and inexpensive. However, if you have serious medical issues or chronic medical problems, these are probably not your best option.  No Primary Care Doctor: - Call Health  Connect at  [phone] - they can help you locate a primary care doctor that  accepts your insurance, provides certain services, etc. - Physician Referral Service- [phone]  Chronic Pain Problems: Organization         Address  Phone   Notes  [facility]  [phone] Patients need to be referred by their primary care doctor.   Medication Assistance: Organization         Address  Phone   Notes  [facility] Medication [facility] [address] [place] [place], [place] [address] [phone] --Must be a resident of [facility] -- Must have NO insurance coverage whatsoever (no Medicaid/ Medicare, etc.) -- The pt. MUST have a primary care doctor that directs their care regularly and follows them in the community   MedAssist  [phone]   [employer]  [phone]    Agencies that provide inexpensive medical care: Organization         Address  Phone   Notes  [facility]  [phone]   [facility]    [phone]   [facility] [address] [place], [place] [address] [phone]   [facility] [address], [place] [phone]   Planned Parenthood    [phone]   [facility]    [phone]   Community [facility] and [place]  [address], [place] Phone:  [phone], Fax:  [phone] Hours of Operation:  9 am - 6 pm, M-F.  Also accepts Medicaid/Medicare and self-pay.  [facility]  [address], [place] Phone: [phone], Fax: [phone]. Hours of Operation:  8:30 am - 5:30 pm, M-F.  Also accepts Medicaid and self-pay.  [facility] [place] [address] [place] Phone: [phone]   [facility] [address] Natasha Bence [place], [place] [phone], Ext. [phone] Mondays & Thursdays: 7-9 AM.  First 15 patients are seen on a first come, first serve basis.     Medicaid-accepting [facility] Providers:  Organization         Address  Phone   Notes  [facility] [address], [place] [phone] Also accepts self-pay patients.  [facility] [address] Laurell Josephs [place], [place]  [phone]   [facility] [address], [place] [phone]   [facility] [address] [place] [phone]   Renaye Rakers [address], [place]   [phone] Only accepts [place] Access [place] patients after they have their name applied to their card.   Self-Pay (no insurance) in [facility]:  Organization  Address  Phone   Notes  Sickle Cell Patients, [facility] Internal Medicine [place] [phone]   [facility] [place] [phone]   [facility] [place]  [place], [address], [place] [phone]   [facility]/Dr. Osei-Bonsu  [address] [place] or [address], [place] [phone] Phone number for both [place] and [place] locations is the same.  [facility] [address], [place] [phone]   [facility] [address], [place] or [address] [phone]   [facility] [address] [place] [phone], phone; [phone], fax Sees patients 1st and 3rd Saturday of every month.  Must not qualify for public or private insurance (i.e. Medicaid, Medicare, [place] Health Choice, Veterans' Benefits)  Household income should be no more than 200% of the poverty level The clinic cannot treat you if you are pregnant or think you are pregnant  Sexually transmitted diseases are not treated at the clinic.    Dental Care: Organization         Address  Phone  Notes  [facility]  Department of [facility] [phone] Accepts children up to age 62 who are enrolled in [place] or [place]; pregnant women with a Medicaid card; and children who have applied for Medicaid or [place] Health Choice, but were declined, whose parents can pay a reduced fee at time of service.  [facility] Department of [facility]  [address], [place] [phone] Accepts children up to age 70 who are enrolled in [place] or [place]; pregnant women with a Medicaid card; and children who have applied for Medicaid or [place] Health Choice, but were declined, whose parents can pay a reduced fee at time of service.  [place] [facility] PRO[facility]  [place] [phone] Patients are seen by appointment only. Walk-ins are not accepted. [place] will see patients 15 years of age and older. Monday - Tuesday (8am-5pm) Most Wednesdays (8:30-5pm) $30 per visit, cash only  [facility] Adult Dental Access PROGRAM  [address], [facility] [phone] Patients are seen by appointment only. Walk-ins are not accepted. [place] will see patients 45 years of age and older. One Wednesday Evening (Monthly: Volunteer Based).  $30 per visit, cash only  [place]  [phone] for adults; Children under age 51, call Graduate Pediatric Dentistry at [phone]. Children aged 69-14, please call [phone] to request a pediatric application.  Dental services are provided in all areas of dental care including fillings, crowns and bridges, complete and partial dentures, implants, gum treatment, root canals, and extractions. Preventive care is also provided. Treatment is provided to both adults and children. Patients are selected via a lottery and there is often a waiting list.   [facility] [address], [place]  [phone]  [email or web address].[email or web address]   [facility] [address] [place], [place] [phone], Ext. [phone] Second and Fourth Thursday of each month, opens at 6:30 AM; Clinic ends at 9 AM.  Patients are seen on a first-come first-served basis, and a limited number are seen during each clinic.   [facility]  [address] [place], [place]  [place], [place] [phone]   Eligibility Requirements You must have lived in [place], [place], or [place] counties for at least the last three months.   You cannot be eligible for state or federal sponsored [employer], including [employer], [place], or [employer].   You generally cannot be eligible for healthcare insurance through your employer.    How to apply: Eligibility screenings are held every Tuesday and Wednesday afternoon from 1:00 pm until 4:00 pm. You do not need an appointment for the interview!  [facility] [address] [place], [place]   [place]  [place] Department  [place]  [phone]    Behavioral Health Resources in the Community: Intensive Outpatient Programs Organization         Address  Phone  Notes  Ione Langeloth. [address] [place], [place] [phone]   [facility] Outpatient [address] [place], [place]   ADS: [facility] [address] [place], [place]   [place] [address],  [place], [place] or [phone]   Substance Abuse Resources Organization         Address  Phone  Notes  Alcohol and Drug Services  [phone]   [place]  [phone]   The [place]   Chinita Pester  [phone]   Residential & Outpatient Substance Abuse Program  [phone]   Psychological Services Organization          Address  Phone  Notes  [facility]  [place]  [phone]   [place] [address], [place] or [phone]    Mobile Crisis Teams Organization         Address  Phone  Notes  [facility], Mobile Crisis Care Unit  [phone]   [facility]ive Psychotherapeutic Services  [address] [place], [place]   [place] Levels [address], [place] [phone]    Self-Help/Support Groups Organization         Address  Phone             Notes  [place]. of [place] - variety of support groups  [place] Call for more information  Narcotics Anonymous (NA), [facility]aring Services [address], [employer] [place]  2 meetings at this location   Special educational needs teacher         Address  Phone  Notes  [facility]idential [facility] [place],    Susan Moore  [phone]   [facility]  [address] [place] [phone], [place], [place]   [place], [place] [phone] Admissions: 8am-3pm M-F  Incentives Substance [place] [address]    [place], [place] [phone]   [facility] [address] Jadene Pierini [place], [place]   [facility] [address]  [place], [place]   [facility]ight Programs - Intensive Outpatient [address] Kristeen [address], [place], [place]   [facility] ([place].) [place].,  [place], [place] or [phone]   [facility] (RTS) [address], [place], [place] Accepts Medicaid  [facility] [place] [address],  [place] [phone] Substance Abuse/Addiction Treatment   [facility] Resources Organization  Address  Phone  Notes  [facility]  [phone]   Angie Fava, PhD [address] Ervin Knack [place], [place]   [phone] or [phone]   [facility] Behavioral   [address] [place], [place] [phone]   [facility] [address] [place], [place] [phone] Insurance/Medicaid/sponsorship through [facility] and Families [address]                                    [place], [place] [phone] Therapy/tele-psych/case  [facility] [address], [place] [phone]    Dr. Lolly Mustache  [phone]   [facility] [place]  [employer] [facility] Dept. 1) [address] [place] 2) [address], [place] 3)  [address], [place] 479 822 1963 [phone]  [phone]   [facility] Child Abuse Hotline [phone] or [phone] (After Hours)

## 2015-03-20 NOTE — ED Notes (Signed)
Pelvic Cart at bedside 

## 2015-03-21 LAB — GC/CHLAMYDIA PROBE AMP (~~LOC~~) NOT AT ARMC
Chlamydia: NEGATIVE
Neisseria Gonorrhea: NEGATIVE

## 2015-11-10 ENCOUNTER — Emergency Department (HOSPITAL_COMMUNITY): Payer: Medicaid Other

## 2015-11-10 ENCOUNTER — Encounter (HOSPITAL_COMMUNITY): Payer: Self-pay | Admitting: *Deleted

## 2015-11-10 ENCOUNTER — Emergency Department (HOSPITAL_COMMUNITY)
Admission: EM | Admit: 2015-11-10 | Discharge: 2015-11-10 | Disposition: A | Discharge status: Home / Self Care | Payer: Medicaid Other | Attending: Emergency Medicine | Admitting: Emergency Medicine

## 2015-11-10 DIAGNOSIS — R6889 Other general symptoms and signs: Secondary | ICD-10-CM

## 2015-11-10 DIAGNOSIS — R197 Diarrhea, unspecified: Secondary | ICD-10-CM | POA: Insufficient documentation

## 2015-11-10 DIAGNOSIS — R52 Pain, unspecified: Secondary | ICD-10-CM | POA: Diagnosis present

## 2015-11-10 DIAGNOSIS — H9209 Otalgia, unspecified ear: Secondary | ICD-10-CM | POA: Diagnosis not present

## 2015-11-10 DIAGNOSIS — Z79899 Other long term (current) drug therapy: Secondary | ICD-10-CM | POA: Diagnosis not present

## 2015-11-10 DIAGNOSIS — F17210 Nicotine dependence, cigarettes, uncomplicated: Secondary | ICD-10-CM | POA: Diagnosis not present

## 2015-11-10 DIAGNOSIS — J029 Acute pharyngitis, unspecified: Secondary | ICD-10-CM | POA: Insufficient documentation

## 2015-11-10 MED ORDER — BENZONATATE 100 MG PO CAPS
100.0000 mg | ORAL_CAPSULE | Freq: Once | ORAL | Status: AC
  Administered 2015-11-10: 100 mg via ORAL
  Filled 2015-11-10: qty 1

## 2015-11-10 MED ORDER — PROMETHAZINE-DM 6.25-15 MG/5ML PO SYRP: 5.0000 mL | ORAL_SOLUTION | Freq: Four times a day (QID) | ORAL | Status: AC | PRN

## 2015-11-10 MED ORDER — GUAIFENESIN 100 MG/5ML PO LIQD: 100.0000 mg | ORAL | Status: AC | PRN

## 2015-11-10 NOTE — ED Notes (Signed)
The pt is c/o a cold and cough for 2 days with burning sensation in her chest when she coughs.  Chills and swaeting prod cough  lmp 3 weeks ago

## 2015-11-10 NOTE — Discharge Instructions (Signed)

## 2015-11-10 NOTE — ED Provider Notes (Signed)
CSN: [id number]     Arrival date & time 11/10/15  0249 History   First MD Initiated Contact with Patient 11/10/15 [phone]     Chief Complaint  Patient presents with  . Generalized Body Aches     (Consider location/radiation/quality/duration/timing/severity/associated sxs/prior Treatment) HPI   30 year old female presents with cold symptoms. Patient report for the past 2 days she has had body aches, chills, sinus congestion, sneezing, productive cough, sore throat, ear pain, and decreased appetite, and having loose stools. Symptoms not improved with taking DayQuil around-the-clock. She's been drinking plenty of fluids. The symptom appears to be a progressively worse prompting her to come to the ER. She denies having fever, neck stiffness, hemoptysis, shortness of breath, abdominal cramping, vomiting, or rash. She did not have a flu shot or pneumonia shot this year. She has been around a friend who suffer from a gunshot wound in which she has been caring for him but he does not have any similar symptoms. She is however concern for possible MRSA and requests to be tested for MRSA since her friend was diagnosed with MRSA. Patient is a smoker.  History reviewed. No pertinent past medical history. Past Surgical History  Procedure Laterality Date  . Wisdom tooth extraction    . Tubal ligation     No family history on file. Social History  Substance Use Topics  . Smoking status: Current Every Day Smoker    Types: Cigarettes  . Smokeless tobacco: None  . Alcohol Use: Yes     Comment: occasionally   OB History    No data available     Review of Systems  All other systems reviewed and are negative.     Allergies  Review of patient's allergies indicates no known allergies.  Home Medications   Prior to Admission medications   Medication Sig Start Date End Date Taking? Authorizing Provider  Multiple Vitamin (MULTIVITAMIN WITH MINERALS) TABS tablet Take 1 tablet by mouth daily.     Historical Provider, MD   BP 125/97 mmHg  Pulse 67  Temp(Src) 97.7 F (36.5 C)  Resp 16  Ht  (1.6 m)  Wt 76.204 kg  BMI 29.77 kg/m2  SpO2 100%  LMP 10/20/2015 Physical Exam  Constitutional: She is oriented to person, place, and time. She appears well-developed and well-nourished. No distress.  African-American female nontoxic in appearance  HENT:  Head: Atraumatic.  Ears: TMs with mild effusion and bulging without erythema Nose: Normal nares Throat: Uvula is midline no tonsillar enlargement or exudates, no trismus  Eyes: Conjunctivae are normal.  Neck: Neck supple.  No nuchal rigidity  Cardiovascular: Normal rate, regular rhythm and intact distal pulses.  Exam reveals no gallop and no friction rub.   No murmur heard. Pulmonary/Chest: Effort normal and breath sounds normal. No respiratory distress. She has no wheezes. She has no rales.  Abdominal: Soft. There is no tenderness.  Musculoskeletal: She exhibits no edema.  Lymphadenopathy:    She has cervical adenopathy.  Neurological: She is alert and oriented to person, place, and time.  Skin: No rash noted.  Psychiatric: She has a normal mood and affect.  Nursing note and vitals reviewed.   ED Course  Procedures (including critical care time) Labs Review Labs Reviewed - No data to display  Imaging Review Dg Chest 2 View  11/10/2015  CLINICAL DATA:  Acute onset of productive cough and burning sensation at the chest. Chills and diaphoresis. Initial encounter. EXAM: CHEST  2 VIEW COMPARISON:  Chest radiograph  performed 04/08/2012 FINDINGS: The lungs are well-aerated and clear. There is no evidence of focal opacification, pleural effusion or pneumothorax. The heart is normal in size; the mediastinal contour is within normal limits. No acute osseous abnormalities are seen. Bilateral metallic nipple piercings are noted. An umbilical piercing is also partially seen. IMPRESSION: No acute cardiopulmonary process seen.  Electronically Signed   By: Roanna RaiderJeffery  Chang M.D.   On: 11/10/2015 03:44   I have personally reviewed and evaluated these images and lab results as part of my medical decision-making.   EKG Interpretation None      MDM   Final diagnoses:  Flu-like symptoms    BP 138/89 mmHg  Pulse 82  Temp(Src) 98.7 F (37.1 C) (Oral)  Resp 18  Ht 5\' 3"  (1.6 m)  Wt 76.204 kg  BMI 29.77 kg/m2  SpO2 99%  LMP 10/20/2015   7:14 AM Patient here with flulike symptoms. His chest x-ray shows no signs of pneumonia. She is afebrile with stable normal vital sign and no hypoxia. She is well-appearing. Plan to provide symptomatically treatment. She is requesting to be tested for MRSA. will perform nasal swab.  Pt made aware that she will have to call in a few days to check for the result.    Fayrene HelperBowie Marai Teehan, PA-C 11/10/15 1507  Zadie Rhineonald Wickline, MD 11/11/15 [phone]

## 2015-11-13 LAB — RESPIRATORY VIRUS PANEL
Adenovirus: NEGATIVE
Influenza A: NEGATIVE
Influenza B: NEGATIVE
Metapneumovirus: NEGATIVE
PARAINFLUENZA 3 A: NEGATIVE
Parainfluenza 1: NEGATIVE
Parainfluenza 2: NEGATIVE
RESPIRATORY SYNCYTIAL VIRUS B: NEGATIVE
RHINOVIRUS: POSITIVE — AB
Respiratory Syncytial Virus A: NEGATIVE

## 2016-03-17 ENCOUNTER — Other Ambulatory Visit: Payer: Self-pay

## 2016-11-18 ENCOUNTER — Emergency Department (HOSPITAL_COMMUNITY)
Admission: EM | Admit: 2016-11-18 | Discharge: 2016-11-18 | Disposition: A | Discharge status: Home / Self Care | Payer: Medicaid Other | Attending: Emergency Medicine | Admitting: Emergency Medicine

## 2016-11-18 ENCOUNTER — Encounter (HOSPITAL_COMMUNITY): Payer: Self-pay | Admitting: *Deleted

## 2016-11-18 ENCOUNTER — Emergency Department (HOSPITAL_COMMUNITY): Payer: Medicaid Other

## 2016-11-18 DIAGNOSIS — Y9289 Other specified places as the place of occurrence of the external cause: Secondary | ICD-10-CM | POA: Insufficient documentation

## 2016-11-18 DIAGNOSIS — Y9368 Activity, volleyball (beach) (court): Secondary | ICD-10-CM | POA: Insufficient documentation

## 2016-11-18 DIAGNOSIS — Y999 Unspecified external cause status: Secondary | ICD-10-CM | POA: Diagnosis not present

## 2016-11-18 DIAGNOSIS — F17210 Nicotine dependence, cigarettes, uncomplicated: Secondary | ICD-10-CM | POA: Insufficient documentation

## 2016-11-18 DIAGNOSIS — M25571 Pain in right ankle and joints of right foot: Secondary | ICD-10-CM | POA: Insufficient documentation

## 2016-11-18 DIAGNOSIS — S99911A Unspecified injury of right ankle, initial encounter: Secondary | ICD-10-CM | POA: Diagnosis present

## 2016-11-18 DIAGNOSIS — X501XXA Overexertion from prolonged static or awkward postures, initial encounter: Secondary | ICD-10-CM | POA: Insufficient documentation

## 2016-11-18 MED ORDER — IBUPROFEN 400 MG PO TABS
400.0000 mg | ORAL_TABLET | Freq: Once | ORAL | Status: AC | PRN
  Administered 2016-11-18: 400 mg via ORAL

## 2016-11-18 MED ORDER — IBUPROFEN 800 MG PO TABS
800.0000 mg | ORAL_TABLET | Freq: Three times a day (TID) | ORAL | 0 refills | Status: AC | PRN
Start: 2016-11-18 — End: ?

## 2016-11-18 MED ORDER — TRAMADOL HCL 50 MG PO TABS
50.0000 mg | ORAL_TABLET | Freq: Once | ORAL | Status: AC
  Administered 2016-11-18: 50 mg via ORAL
  Filled 2016-11-18: qty 1

## 2016-11-18 MED ORDER — TRAMADOL HCL 50 MG PO TABS: 50.0000 mg | ORAL_TABLET | Freq: Four times a day (QID) | ORAL | 0 refills | Status: AC | PRN

## 2016-11-18 MED ORDER — IBUPROFEN 400 MG PO TABS
ORAL_TABLET | ORAL | Status: AC
  Filled 2016-11-18: qty 1

## 2016-11-18 NOTE — Progress Notes (Signed)
Orthopedic Tech Progress Note Patient Details:  Erin Logan 15-May-1986 [id number]  Ortho Devices Type of Ortho Device: Crutches, CAM walker Ortho Device/Splint Location: RLE Ortho Device/Splint Interventions: Ordered, Application   Jennye MoccasinHughes, Krishang Reading Craig 11/18/2016, 9:49 PM

## 2016-11-18 NOTE — Discharge Instructions (Signed)
You have sprained your ankle.  The x-rays did not show any abnormalities other than the swelling to the joint.  Ice and elevate your ankle.  Follow-up with the orthopedist provided

## 2016-11-18 NOTE — ED Provider Notes (Signed)
[facility]-EMERGENCY DEPT Provider Note   CSN: [id number] Arrival date & time: 11/18/16  2006  By signing my name below, I, Teofilo Pod, attest that this documentation has been prepared under the direction and in the presence of [employer], PA-C. Electronically Signed: Teofilo Pod, ED Scribe. 11/18/2016. 9:21 PM.    History   Chief Complaint Chief Complaint  Patient presents with  . Ankle Pain   The history is provided by the patient. No language interpreter was used.  HPI Comments:  Erin Logan is a 31 y.o. female who presents to the Emergency Department complaining of constant right ankle pain following an injury PTA. Pt reports that she was playing volleyball today, and then she fell, twisting her right ankle. Pt complains of associated swelling to the ankle. Pt reports a previous right ankle injury 1 month ago after she fell off of a fence on to her right foot. Pt has used ice with no relief. Pt denies other associated symptoms.   History reviewed. No pertinent past medical history.  There are no active problems to display for this patient.   Past Surgical History:  Procedure Laterality Date  . TUBAL LIGATION    . WISDOM TOOTH EXTRACTION      OB History    No data available       Home Medications    Prior to Admission medications   Medication Sig Start Date End Date Taking? Authorizing Provider  guaiFENesin (ROBITUSSIN) 100 MG/5ML liquid Take 5-10 mLs (100-200 mg total) by mouth every 4 (four) hours as needed for congestion. 11/10/15   Fayrene Helper, PA-C  Multiple Vitamin (MULTIVITAMIN WITH MINERALS) TABS tablet Take 1 tablet by mouth daily.    [provider]  promethazine-dextromethorphan (PROMETHAZINE-DM) 6.25-15 MG/5ML syrup Take 5 mLs by mouth 4 (four) times daily as needed for cough. 11/10/15   Fayrene Helper, PA-C    Family History No family history on file.  Social History Social History  Substance Use Topics  . Smoking status: Current  Every Day Smoker    Types: Cigarettes  . Smokeless tobacco: Not on file  . Alcohol use Yes     Comment: occasionally     Allergies   Patient has no known allergies.   Review of Systems Review of Systems All other systems negative except as documented in the HPI. All pertinent positives and negatives as reviewed in the HPI.   Physical Exam Updated Vital Signs BP (!) 148/106 (BP Location: Right Arm)   Pulse (!) 106   Temp 98.4 F (36.9 C) (Oral)   Resp (!) 22   LMP 11/15/2016   SpO2 99%   Physical Exam  Constitutional: She appears well-developed and well-nourished. No distress.  HENT:  Head: Normocephalic and atraumatic.  Eyes: Conjunctivae are normal.  Cardiovascular: Normal rate.   Pulmonary/Chest: Effort normal.  Abdominal: She exhibits no distension.  Musculoskeletal:  Lateral right ankle swelling decreased ROM. Palpable tenderness along lateral aspect of right ankle.   Neurological: She is alert.  Skin: Skin is warm and dry.  Psychiatric: She has a normal mood and affect.  Nursing note and vitals reviewed.    ED Treatments / Results  DIAGNOSTIC STUDIES:  Oxygen Saturation is 99% on RA, normal by my interpretation.    COORDINATION OF CARE:  9:19 PM Discussed treatment plan with pt at bedside and pt agreed to plan.   Labs (all labs ordered are listed, but only abnormal results are displayed) Labs Reviewed - No data  to display  EKG  EKG Interpretation None       Radiology Dg Ankle Complete Right  Result Date: 11/18/2016 CLINICAL DATA:  Lateral right ankle pain after fall today with twisting injury playing volleyball. Right ankle injury 6 weeks priors well. EXAM: RIGHT ANKLE - COMPLETE 3+ VIEW COMPARISON:  Radiographs 04/20/2010 FINDINGS: There is no evidence of fracture or dislocation. Ankle mortise is preserved. There is no evidence of arthropathy or other focal bone abnormality. Lateral soft tissue edema. Small tibiotalar joint effusion. IMPRESSION:  No acute osseous abnormality. Small tibial talar joint effusion and lateral soft tissue edema. Electronically Signed   By: Rubye OaksMelanie  Ehinger M.D.   On: 11/18/2016 20:51    Procedures Procedures (including critical care time)  Medications Ordered in ED Medications  ibuprofen (ADVIL,MOTRIN) tablet 400 mg (400 mg Oral Given 11/18/16 2015)     Initial Impression / Assessment and Plan / ED Course  I have reviewed the triage vital signs and the nursing notes.  Pertinent labs & imaging results that were available during my care of the patient were reviewed by me and considered in my medical decision making (see chart for details).  Patient X-Ray negative for obvious fracture or dislocation.  Pt advised to follow up with orthopedics. Patient given Cam walker while in ED, conservative therapy recommended and discussed. Patient will be discharged home & is agreeable with above plan. Returns precautions discussed. Pt appears safe for discharge.      Final Clinical Impressions(s) / ED Diagnoses   Final diagnoses:  None    New Prescriptions New Prescriptions   No medications on file  I personally performed the services described in this documentation, which was scribed in my presence. The recorded information has been reviewed and is accurate.    Charlestine NightLawyer, Fatumata Kashani, PA-C 11/20/16 0139    Marily MemosMesner, Jason, MD 11/21/16 [phone]

## 2016-11-18 NOTE — ED Triage Notes (Signed)
Pt c/o R ankle pain after falling today when playing volleyball. Pt reports having a previous injury to R ankle a month ago when she attempted to jump a fence and fell onto foot.

## 2018-03-03 IMAGING — CR DG CHEST 2V
2 series · 2 of 2 positions shown · non-contrast
Comparison: Chest radiograph performed 04/08/2012

CLINICAL DATA: Acute onset of productive cough and burning
sensation at the chest. Chills and diaphoresis. Initial encounter.

EXAM:
CHEST  2 VIEW

[chest pa]
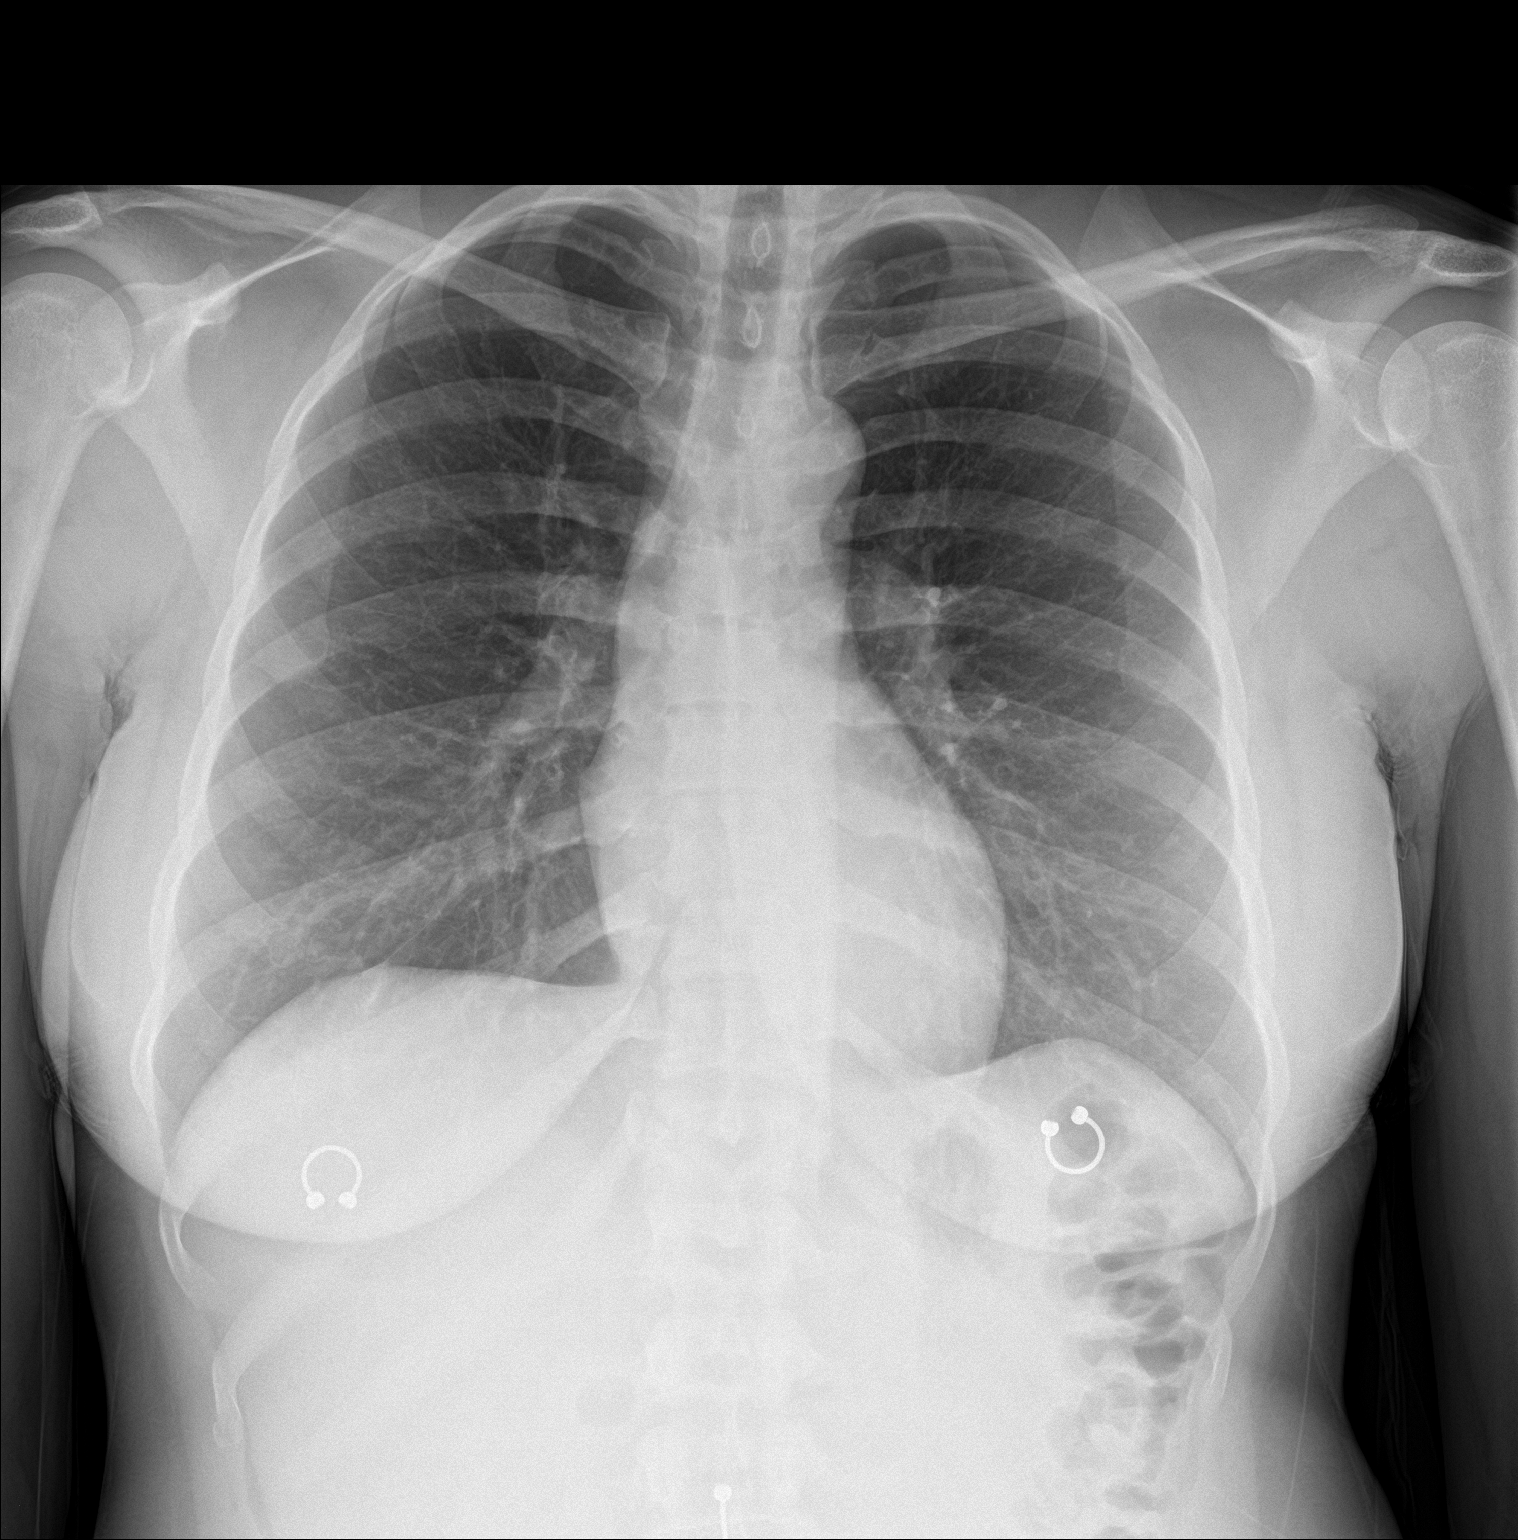

[chest ap]
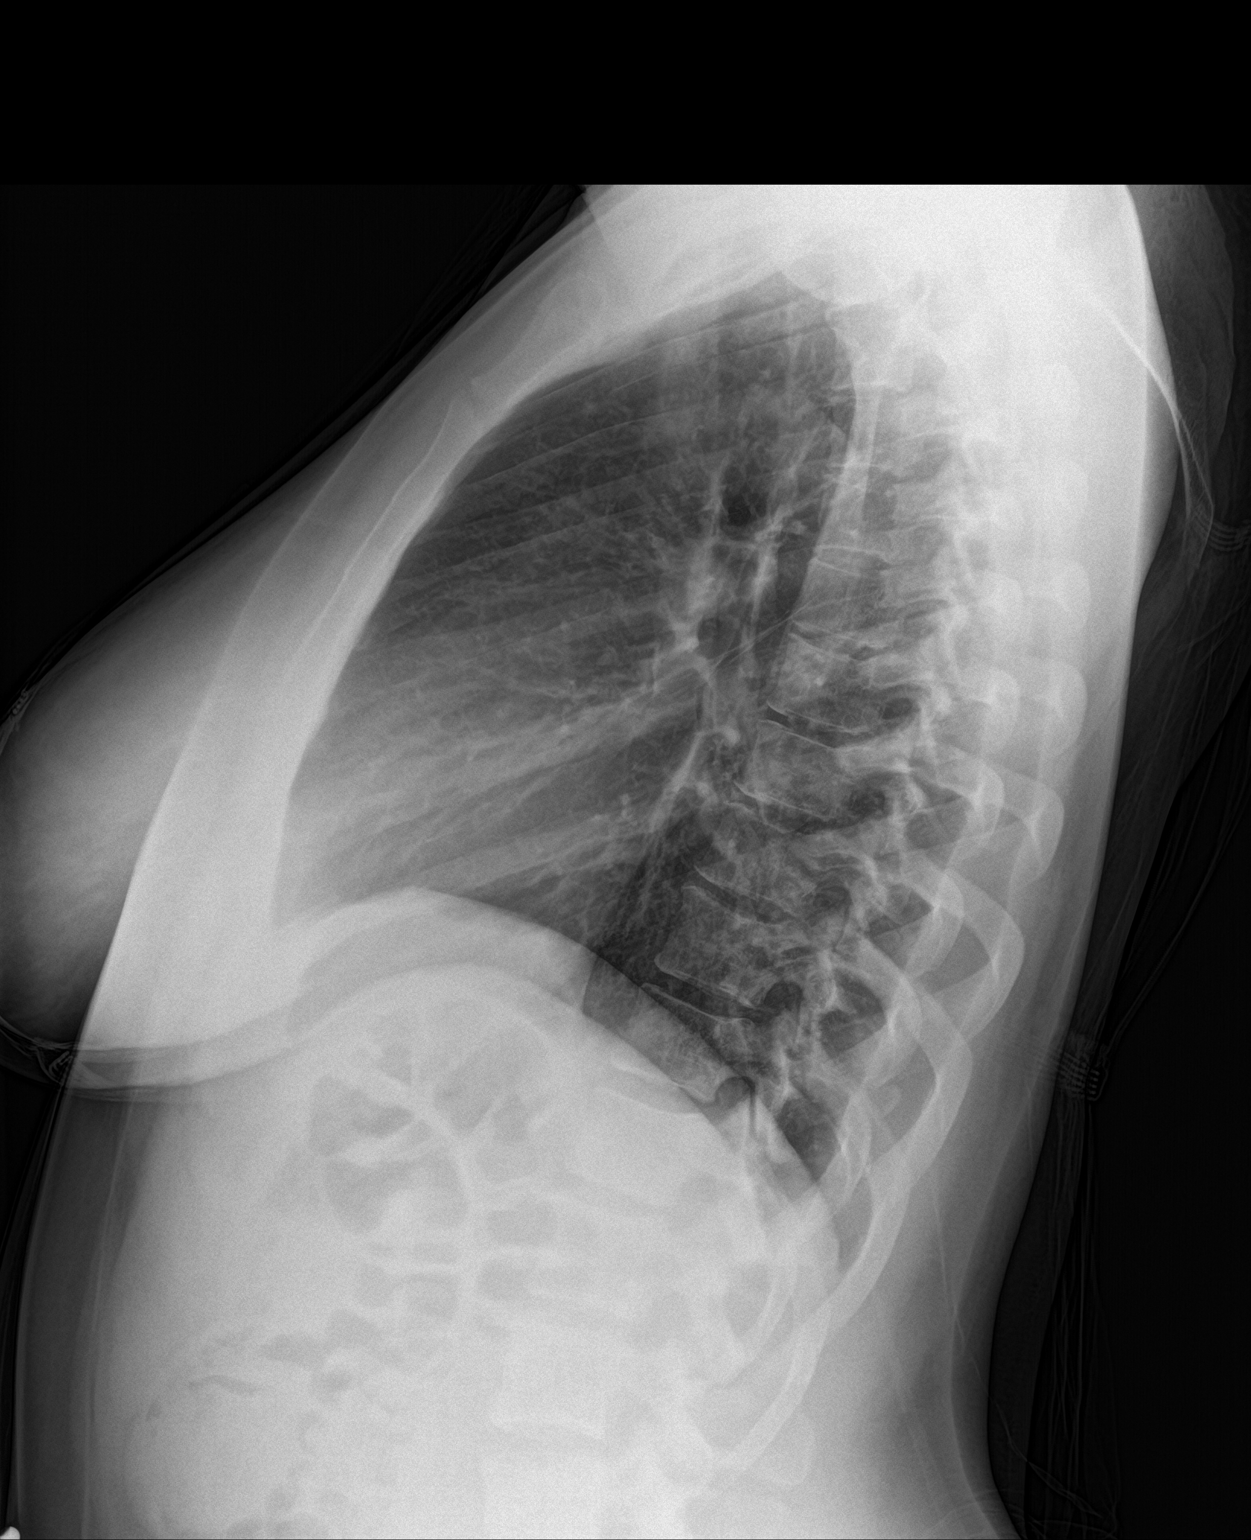

[2 of 2 positions shown; findings below may reference images not displayed]

FINDINGS: The lungs are well-aerated and clear. There is no evidence of focal
opacification, pleural effusion or pneumothorax.

The heart is normal in size; the mediastinal contour is within
normal limits. No acute osseous abnormalities are seen. Bilateral
metallic nipple piercings are noted. An umbilical piercing is also
partially seen.
IMPRESSION: No acute cardiopulmonary process seen.

## 2019-03-12 IMAGING — DX DG ANKLE COMPLETE 3+V*R*
3 series · 3 of 3 positions shown · non-contrast
Comparison: Radiographs 04/20/2010

CLINICAL DATA: Lateral right ankle pain after fall today with
twisting injury playing volleyball. Right ankle injury 6 weeks
priors well.

EXAM:
RIGHT ANKLE - COMPLETE 3+ VIEW

[x ankle ap right]
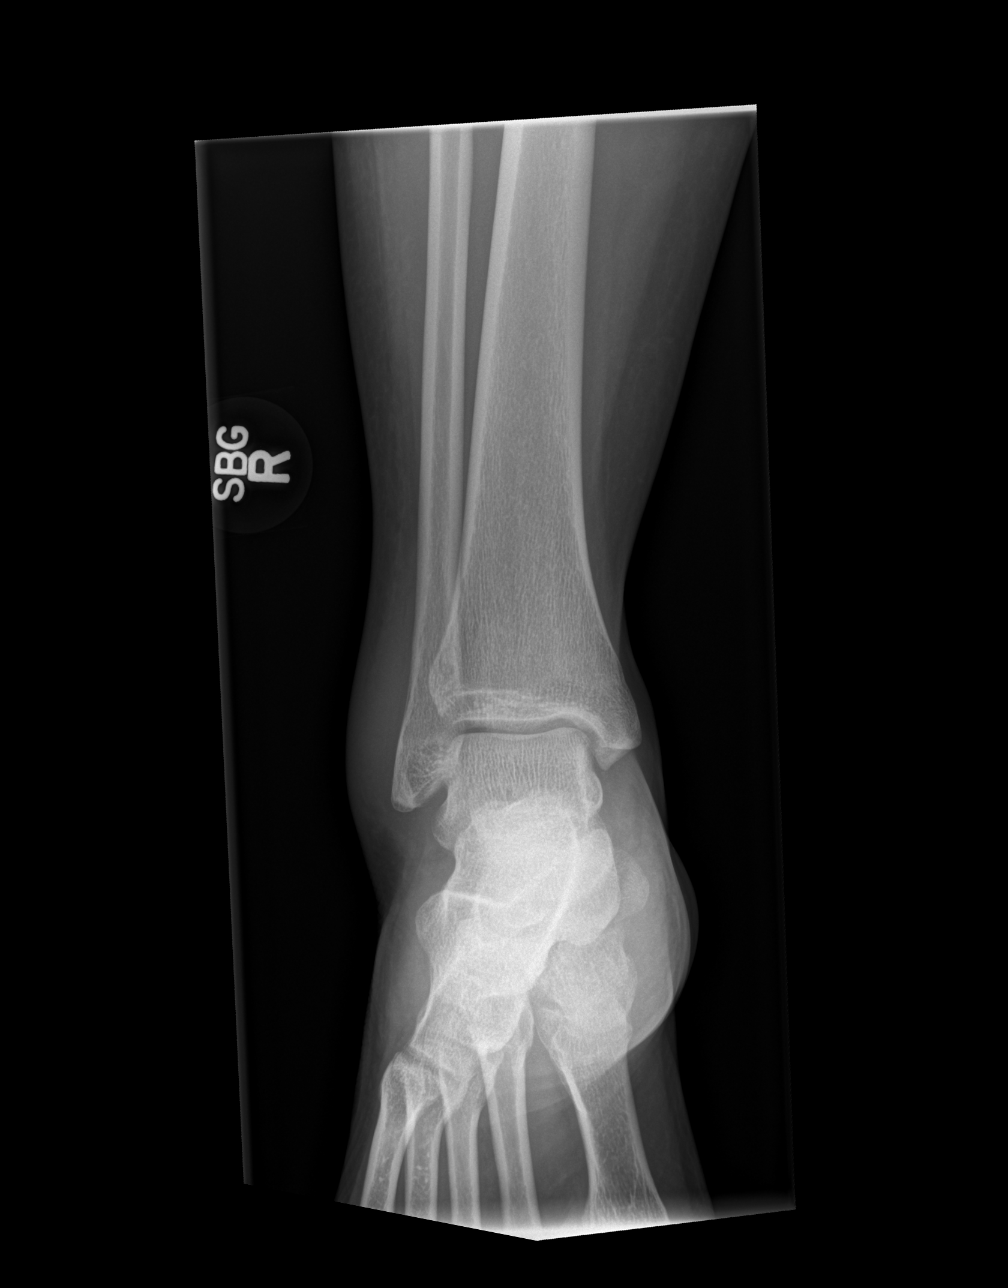

[x ankle obl right]
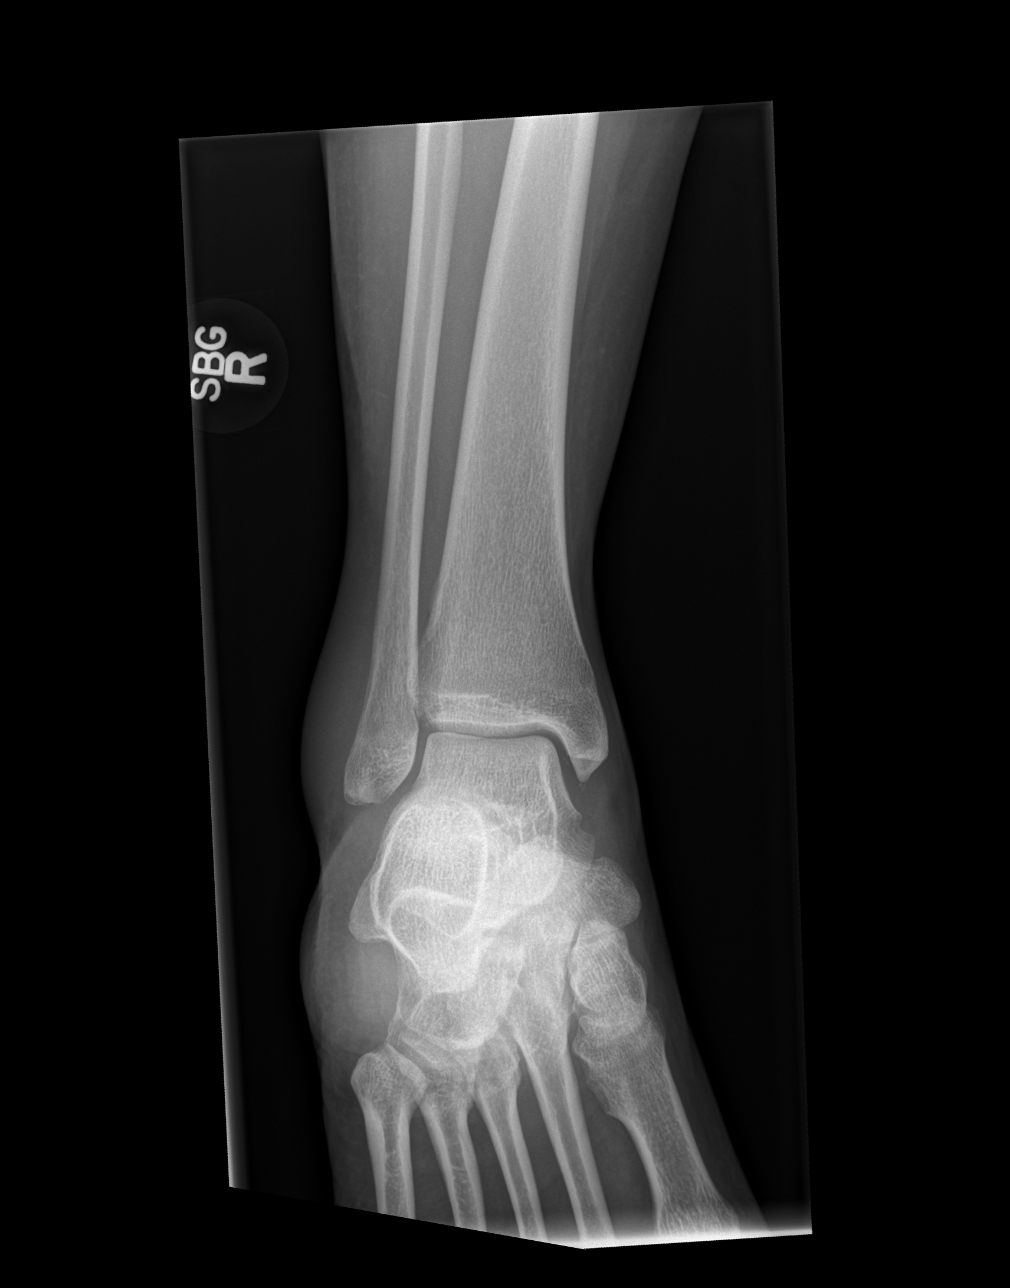

[x ankle lat right]
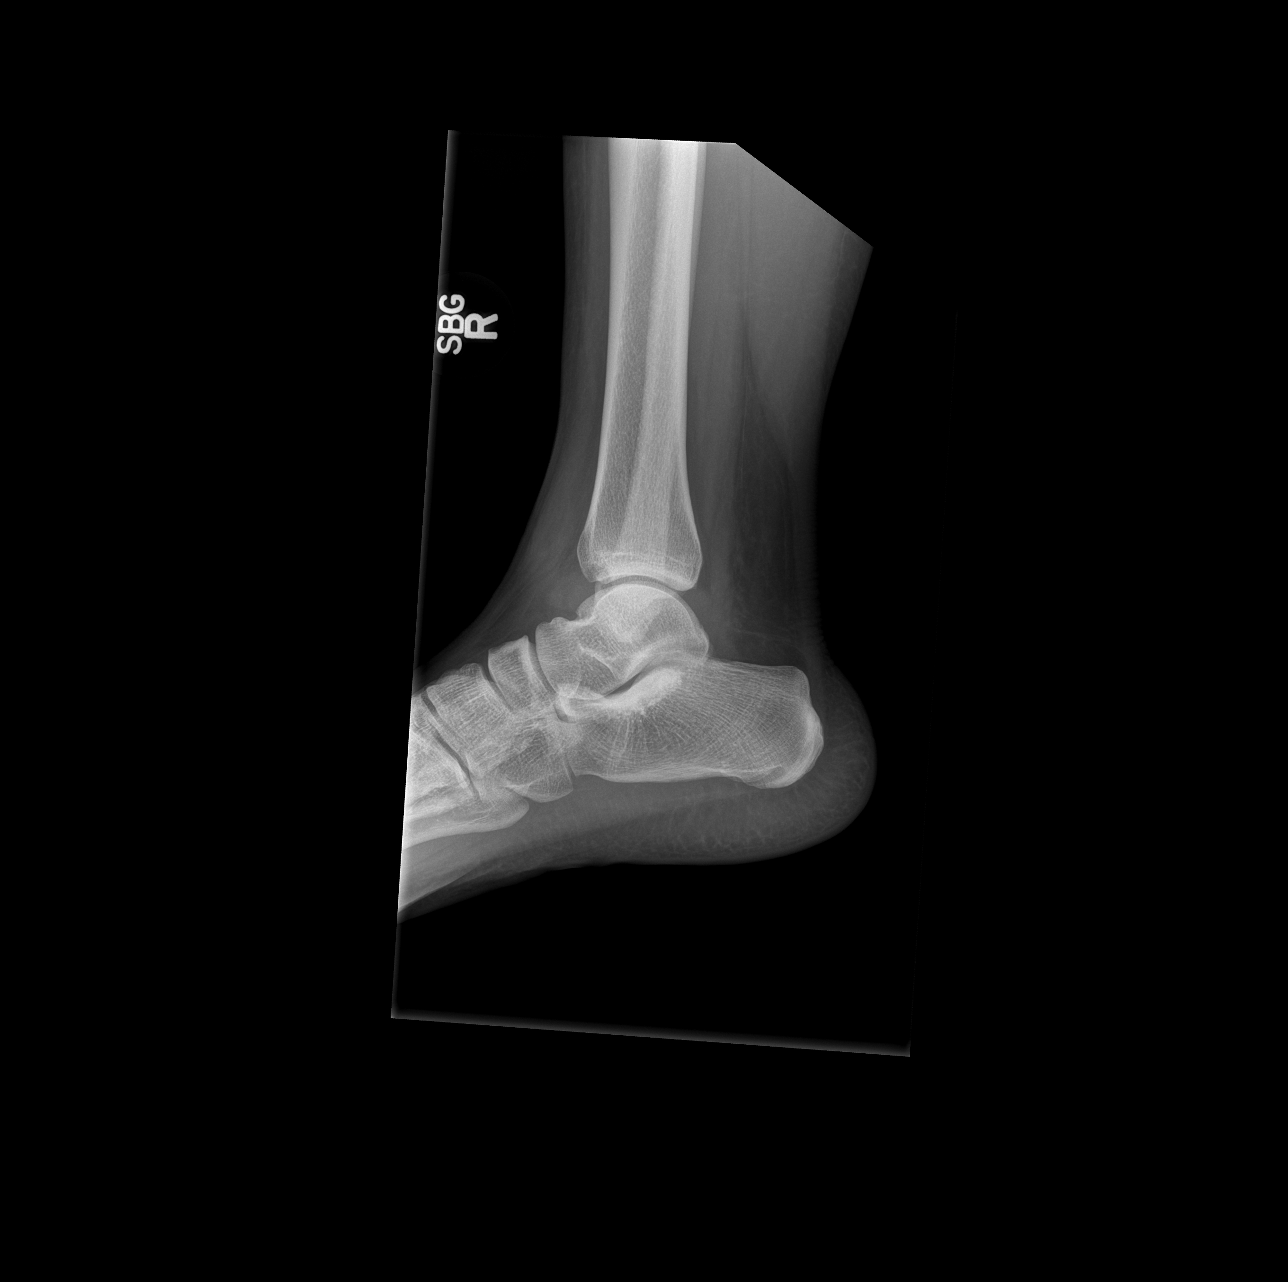

[3 of 3 positions shown; findings below may reference images not displayed]

FINDINGS: There is no evidence of fracture or dislocation. Ankle mortise is
preserved. There is no evidence of arthropathy or other focal bone
abnormality. Lateral soft tissue edema. Small tibiotalar joint
effusion.
IMPRESSION: No acute osseous abnormality.

Small tibial talar joint effusion and lateral soft tissue edema.

## 2022-01-28 ENCOUNTER — Ambulatory Visit (INDEPENDENT_AMBULATORY_CARE_PROVIDER_SITE_OTHER): Payer: Commercial Managed Care - HMO

## 2022-01-28 ENCOUNTER — Ambulatory Visit (INDEPENDENT_AMBULATORY_CARE_PROVIDER_SITE_OTHER): Payer: Commercial Managed Care - HMO | Admitting: Orthopaedic Surgery

## 2022-01-28 ENCOUNTER — Encounter: Payer: Self-pay | Admitting: Orthopaedic Surgery

## 2022-01-28 DIAGNOSIS — M25552 Pain in left hip: Secondary | ICD-10-CM

## 2022-01-28 DIAGNOSIS — M25512 Pain in left shoulder: Secondary | ICD-10-CM

## 2022-01-28 NOTE — Progress Notes (Signed)
Office Visit Note   Patient: Erin Logan           Date of Birth: December 16, 1985           MRN: [id number] Visit Date: 01/28/2022              Requested by: No referring provider defined for this encounter. PCP: Patient, No Pcp Per   Assessment & Plan: Visit Diagnoses:  1. Acute pain of left shoulder   2. Pain in left hip     Plan: Ms. Melder relates that she fell 2 days ago landing on her left side.  She has been experiencing increasing pain to the point of compromise.  She is having difficulty raising her left arm over her head and even "reaching".  She has had some referred pain rating into her neck but "not the other way".  She denies any numbness or tingling.  Most of her pain is along the anterior shoulder with some referred discomfort into the deltoid muscle and proximal humerus.  She feels like her shoulder is tight and always feels like she has to "stretch it".  Occasionally the "muscles feel on fire".  Never had a problem with the shoulder in the past.  Her left hip pain is actually in the groin it is oftentimes "sharp" it mostly occurs at night and there is no referred pain or numbness or tingling.  Films of her pelvis were negative for any acute changes.  She may have femoral acetabular impingement potentially.  It is only been 2 days so we will just give this a bit of time with over-the-counter medicines to see if it improves.  I would like to check her back in 2 weeks.  She is having more trouble with her left shoulder.  Films also did not reveal any acute changes.  I injected the subacromial space with Depo-Medrol Xylocaine and Marcaine and will monitor response over the next several weeks.  Unusual reaction to the injection with pain.  We will give her a note keeping her out of work for at least a week and possibly to  Follow-Up Instructions: Return in about 2 weeks (around 02/11/2022).   Orders:  Orders Placed This Encounter  Procedures   Large Joint Inj: L subacromial bursa   XR  Shoulder Left   XR HIP UNILAT W OR W/O PELVIS 2-3 VIEWS LEFT   No orders of the defined types were placed in this encounter.     Procedures: Large Joint Inj: L subacromial bursa on 01/28/2022 4:35 PM Indications: pain and diagnostic evaluation Details: 25 G 1.5 in needle, anterolateral approach  Arthrogram: No  Medications: 2 mL lidocaine 2 %; 60 mg methylPREDNISolone acetate 40 MG/ML; 2 mL bupivacaine 0.25 % Consent was given by the patient. Immediately prior to procedure a time out was called to verify the correct patient, procedure, equipment, support staff and site/side marked as required. Patient was prepped and draped in the usual sterile fashion.       Clinical Data: No additional findings.   Subjective: Chief Complaint  Patient presents with   Left Shoulder - New Patient (Initial Visit)   Left Hip - New Patient (Initial Visit)   Patient presents today as a new patient with left shoulder and left hip pain. She states that she had a injury a few days ago when she slipped and injured her shoulder and knee. Patient states that left shoulder pain is increased with she is attempting and ROM or overhead  reaching motions. Patient denies having any prior surgeries on either body location. With regards to her left hip she has noticed increased pain while sitting up right and describes the pain as sharp.   Review of Systems   Objective: Vital Signs: There were no vitals taken for this visit.  Physical Exam Constitutional:      Appearance: She is well-developed.  Eyes:     Pupils: Pupils are equal, round, and reactive to light.  Pulmonary:     Effort: Pulmonary effort is normal.  Skin:    General: Skin is warm and dry.  Neurological:     Mental Status: She is alert and oriented to person, place, and time.  Psychiatric:        Behavior: Behavior normal.     Ortho Exam awake alert and oriented x3.  Comfortable sitting.  Does not appear to be in any acute distress.   Painful overhead motion of the left shoulder but no loss of motion.  Some pain with internal/external rotation consistent with positive impingement and positive empty can testing.  No grinding or grating.  No neck pain with range of motion or referred pain to the shoulder with motion of the neck.  Some soreness around the proximal humerus but no swelling or skin changes.  Neurologically intact.  No elbow pain.  Painless range of motion of left hip.  Straight leg raise negative.  No pain about the lateral hip joint or thigh.  No knee pain.  Walk without a limp.  No use of ambulatory aid  Specialty Comments:  No specialty comments available.  Imaging: XR HIP UNILAT W OR W/O PELVIS 2-3 VIEWS LEFT  Result Date: 01/28/2022 AP pelvis and left hip were obtained.  There is some lateral prominence of the acetabulum with some very mild calcification but no acute change.  Hip joint is well-maintained.  There is some prominence along the anterior head-neck junction that could predispose to femoral acetabular impingement  XR Shoulder Left  Result Date: 01/28/2022 Films of the left shoulder obtained in several projections.  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  No other ectopic calcification.  Humeral head centered about the glenoid.  Normal space between the humeral head and the acromion.  No acute changes    PMFS History: Patient Active Problem List   Diagnosis Date Noted   Pain in left shoulder 01/28/2022   Pain in left hip 01/28/2022   History reviewed. No pertinent past medical history.  History reviewed. No pertinent family history.  Past Surgical History:  Procedure Laterality Date   TUBAL LIGATION     WISDOM TOOTH EXTRACTION     Social History   Occupational History   Not on file  Tobacco Use   Smoking status: Every Day    Types: Cigarettes   Smokeless tobacco: Not on file  Substance and Sexual  Activity   Alcohol use: Yes    Comment: occasionally   Drug use: No   Sexual activity: Yes    Birth control/protection: Surgical

## 2022-01-29 MED ORDER — LIDOCAINE HCL 2 % IJ SOLN
2.0000 mL | INTRAMUSCULAR | Status: AC | PRN
  Administered 2022-01-28: 2 mL

## 2022-01-29 MED ORDER — BUPIVACAINE HCL 0.25 % IJ SOLN
2.0000 mL | INTRAMUSCULAR | Status: AC | PRN
  Administered 2022-01-28: 2 mL via INTRA_ARTICULAR

## 2022-01-29 MED ORDER — METHYLPREDNISOLONE ACETATE 40 MG/ML IJ SUSP
60.0000 mg | INTRAMUSCULAR | Status: AC | PRN
  Administered 2022-01-28: 60 mg via INTRA_ARTICULAR

## 2022-02-11 ENCOUNTER — Ambulatory Visit (INDEPENDENT_AMBULATORY_CARE_PROVIDER_SITE_OTHER): Payer: Commercial Managed Care - HMO | Admitting: Orthopaedic Surgery

## 2022-02-11 ENCOUNTER — Encounter: Payer: Self-pay | Admitting: Orthopaedic Surgery

## 2022-02-11 DIAGNOSIS — M25512 Pain in left shoulder: Secondary | ICD-10-CM

## 2022-02-11 NOTE — Progress Notes (Signed)
Office Visit Note   Patient: Erin Logan           Date of Birth: 1986-02-23           MRN: [id number] Visit Date: 02/11/2022              Requested by: No referring provider defined for this encounter. PCP: Patient, No Pcp Per   Assessment & Plan: Visit Diagnoses:  1. Acute pain of left shoulder     Plan: [name] was seen several weeks ago for evaluation of acute onset of left shoulder pain.  She had fallen several days prior to that was having difficulty raising her arm over her head.  Her x-rays were nondiagnostic.  I performed a subacromial cortisone injection she notes that she is significantly better.  She is now able to sore arm over her head.  She cannot fasten her bra from the back as yet.  On a scale of 1-10 she thought she was probably about a 5 or 6 where she was a 10 when she was here in the office.  She still having a little soreness with her neck and I think there is always a possibility that some of her pain could be referred from her neck.  I would like to try a course of physical therapy for both her shoulder and her neck.  She might have an early adhesive capsulitis.  Like to check her back in about a month.  She take sa combination of Tylenol and NSAIDs and she is happy with that.  No related groin pain today. Had mentioned a chiropractor I think that is fine but I would like her to try the physical therapy. We will give her a note saying she can return to work today or tomorrow no lifting over 20 pounds for at least 2 weeks.  Like to see her back in a month  Follow-Up Instructions: Return in about 1 month (around 03/14/2022).   Orders:  No orders of the defined types were placed in this encounter.  No orders of the defined types were placed in this encounter.     Procedures: No procedures performed   Clinical Data: No additional findings.   Subjective: Chief Complaint  Patient presents with   Left Shoulder - Follow-up   Patient presents today for follow  up of her left shoulder pain. Patient states that she was last given injection on 01/28/2022 and states that injection did help with her pain however she has been having stiffness and sore pains noticed to be related to muscle pain. States that she would like to know if chiropractor able to be recommended. At this time she is currently taking OTC ibuprofen and tylenol regiment.   Review of Systems   Objective: Vital Signs: There were no vitals taken for this visit.  Physical Exam Constitutional:      Appearance: She is well-developed.  Eyes:     Pupils: Pupils are equal, round, and reactive to light.  Pulmonary:     Effort: Pulmonary effort is normal.  Skin:    General: Skin is warm and dry.  Neurological:     Mental Status: She is alert and oriented to person, place, and time.  Psychiatric:        Behavior: Behavior normal.     Ortho Exam awake alert and oriented x3.  Comfortable sitting and in no acute distress.  Able to place her left arm fully overhead.  She had a little loss  of external rotation and internal rotation possibly consistent with a very early adhesive capsulitis.  Minimally positive impingement testing.  Negative speeds sign.  Good grip and release and good strength.  Able to touch her chin to her chest and had nearly full neck extension.  With rotation of the neck to the right and left she had little referred pain to the left trapezium.  Specialty Comments:  No specialty comments available.  Imaging: No results found.   PMFS History: Patient Active Problem List   Diagnosis Date Noted   Pain in left shoulder 01/28/2022   Pain in left hip 01/28/2022   History reviewed. No pertinent past medical history.  History reviewed. No pertinent family history.  Past Surgical History:  Procedure Laterality Date   TUBAL LIGATION     WISDOM TOOTH EXTRACTION     Social History   Occupational History   Not on file  Tobacco Use   Smoking status: Every Day     Types: Cigarettes   Smokeless tobacco: Not on file  Substance and Sexual Activity   Alcohol use: Yes    Comment: occasionally   Drug use: No   Sexual activity: Yes    Birth control/protection: Surgical

## 2023-07-05 ENCOUNTER — Telehealth: Payer: Self-pay | Admitting: Orthopaedic Surgery

## 2023-07-05 NOTE — Telephone Encounter (Signed)
Needs a CD of her xrays Would like call back when available

## 2023-07-12 NOTE — Telephone Encounter (Signed)
Patient aware CD is ready for pickup °

## 2024-05-15 ENCOUNTER — Encounter: Payer: Self-pay | Admitting: Radiology
